# Patient Record
Sex: Female | Born: 1955 | Race: White | Hispanic: No | Smoking: Former smoker
Health system: Southern US, Community
[De-identification: ages and names within clinical notes are randomized; demographics above are authoritative.]

## PROBLEM LIST (undated history)

## (undated) DIAGNOSIS — N814 Uterovaginal prolapse, unspecified: Secondary | ICD-10-CM

## (undated) DIAGNOSIS — I471 Supraventricular tachycardia: Secondary | ICD-10-CM

## (undated) DIAGNOSIS — I10 Essential (primary) hypertension: Secondary | ICD-10-CM

## (undated) DIAGNOSIS — G935 Compression of brain: Secondary | ICD-10-CM

## (undated) DIAGNOSIS — K5792 Diverticulitis of intestine, part unspecified, without perforation or abscess without bleeding: Secondary | ICD-10-CM

## (undated) HISTORY — PX: OTHER SURGICAL HISTORY: SHX169

## (undated) HISTORY — PX: ABDOMINAL SURGERY: SHX537

## (undated) HISTORY — DX: Essential (primary) hypertension: I10

## (undated) HISTORY — DX: Supraventricular tachycardia: I47.1

## (undated) HISTORY — PX: ABDOMINAL HYSTERECTOMY: SHX81

## (undated) HISTORY — PX: COLON RESECTION: SHX5231

---

## 2012-07-26 ENCOUNTER — Encounter (HOSPITAL_COMMUNITY): Payer: Self-pay | Admitting: Emergency Medicine

## 2012-07-26 ENCOUNTER — Emergency Department (HOSPITAL_COMMUNITY)
Admission: EM | Admit: 2012-07-26 | Discharge: 2012-07-26 | Disposition: A | Discharge status: Home / Self Care | Payer: Medicaid - Out of State | Attending: Emergency Medicine | Admitting: Emergency Medicine

## 2012-07-26 ENCOUNTER — Emergency Department (HOSPITAL_COMMUNITY): Payer: Medicaid - Out of State

## 2012-07-26 DIAGNOSIS — I10 Essential (primary) hypertension: Secondary | ICD-10-CM | POA: Insufficient documentation

## 2012-07-26 DIAGNOSIS — S2232XA Fracture of one rib, left side, initial encounter for closed fracture: Secondary | ICD-10-CM

## 2012-07-26 DIAGNOSIS — Y9289 Other specified places as the place of occurrence of the external cause: Secondary | ICD-10-CM | POA: Insufficient documentation

## 2012-07-26 DIAGNOSIS — Y9389 Activity, other specified: Secondary | ICD-10-CM | POA: Insufficient documentation

## 2012-07-26 DIAGNOSIS — Z79899 Other long term (current) drug therapy: Secondary | ICD-10-CM | POA: Insufficient documentation

## 2012-07-26 DIAGNOSIS — W108XXA Fall (on) (from) other stairs and steps, initial encounter: Secondary | ICD-10-CM | POA: Insufficient documentation

## 2012-07-26 DIAGNOSIS — Z8719 Personal history of other diseases of the digestive system: Secondary | ICD-10-CM | POA: Insufficient documentation

## 2012-07-26 DIAGNOSIS — S2239XA Fracture of one rib, unspecified side, initial encounter for closed fracture: Secondary | ICD-10-CM | POA: Insufficient documentation

## 2012-07-26 HISTORY — DX: Diverticulitis of intestine, part unspecified, without perforation or abscess without bleeding: K57.92

## 2012-07-26 MED ORDER — OXYCODONE-ACETAMINOPHEN 5-325 MG PO TABS: 1.0000 | ORAL_TABLET | ORAL | Status: DC | PRN

## 2012-07-26 MED ORDER — HYDROCODONE-ACETAMINOPHEN 5-325 MG PO TABS: 1.0000 | ORAL_TABLET | ORAL | Status: DC | PRN

## 2012-07-26 MED ORDER — HYDROCODONE-ACETAMINOPHEN 5-325 MG PO TABS
1.0000 | ORAL_TABLET | Freq: Once | ORAL | Status: AC
  Administered 2012-07-26: 1 via ORAL
  Filled 2012-07-26: qty 1

## 2012-07-26 NOTE — ED Notes (Signed)
Pt given incentive spirometer. Pt instructed on usage. Pt returned demonstration and verbalized understanding of device.

## 2012-07-26 NOTE — ED Provider Notes (Signed)
Medical screening examination/treatment/procedure(s) were performed by non-physician practitioner and as supervising physician I was immediately available for consultation/collaboration.   Gavin Pound. Karolina Zamor, MD 07/26/12 1810

## 2012-07-26 NOTE — ED Provider Notes (Signed)
History    This chart was scribed for non-physician practitioner working with Gavin Pound. Oletta Lamas, MD by Leone Payor, ED Scribe. This patient was seen in room WTR6/WTR6 and the patient's care was started at 1504.   CSN: [id number]  Arrival date & time 07/26/12  1504   First MD Initiated Contact with Patient 07/26/12 1639      Chief Complaint  Patient presents with  . Fall     The history is provided by the patient. No language interpreter was used.    Adrienne Marsh is a 57 y.o. female who presents to the Emergency Department complaining of new, constant, unchanged right sided rib cage pain starting 1 day ago. Pt reports that last night she fell on her left side going down 4 steps on her porch, reports her knee gave out. States she has not had problems in the past with her legs and she felt fine prior to the fall. Pt states she was okay when she fell but while sleeping she felt a crack and had significant pain after that. Her pain is sharp and stabbing. Pain is worse with deep breathing, movement and touch. States bracing the area and staying still helps her pain. Denies LOC, head injury, dizziness, lightheadedness, vomiting, cough or coughing blood, weakness or numbness in extremities.     Pt has h/o HTN.  Pt is an occasional alcohol user but denies smoking. Past Medical History  Diagnosis Date  . Hypertension   . Diverticulitis     Past Surgical History  Procedure Laterality Date  . Abdominal hysterectomy      No family history on file.  History  Substance Use Topics  . Smoking status: Not on file  . Smokeless tobacco: Not on file  . Alcohol Use: Yes    No OB history provided.   Review of Systems  Respiratory: Negative for cough.   Gastrointestinal: Negative for vomiting.  Neurological: Negative for dizziness, syncope, weakness, light-headedness and numbness.    Allergies  Review of patient's allergies indicates no known allergies.  Home Medications   Current  Outpatient Rx  Name  Route  Sig  Dispense  Refill  . calcium-vitamin D (OSCAL WITH D) 500-200 MG-UNIT per tablet   Oral   Take 1 tablet by mouth 2 (two) times daily.         . metoprolol tartrate (LOPRESSOR) 25 MG tablet   Oral   Take 25 mg by mouth 2 (two) times daily.         Marland Kitchen terbinafine (LAMISIL) 250 MG tablet   Oral   Take 250 mg by mouth every morning.         . tolterodine (DETROL) 2 MG tablet   Oral   Take 2 mg by mouth every evening.         . triamterene-hydrochlorothiazide (DYAZIDE) 37.5-25 MG per capsule   Oral   Take 1 capsule by mouth every evening.         . Venlafaxine HCl 225 MG TB24   Oral   Take 1 tablet by mouth every evening.           BP 143/52  Pulse 69  Temp(Src) 98.8 F (37.1 C) (Oral)  Resp 16  SpO2 100%  Physical Exam  Nursing note and vitals reviewed. Constitutional: She appears well-developed and well-nourished. No distress.  HENT:  Head: Normocephalic and atraumatic.  Neck: Neck supple.  Cardiovascular: Normal rate and regular rhythm.   Pulmonary/Chest: Effort normal and breath sounds  normal. No respiratory distress. She has no wheezes. She has no rales. She exhibits tenderness.    Abdominal: Soft. She exhibits no distension and no mass. There is no tenderness. There is no rebound and no guarding.  Musculoskeletal:  Spine non tender, no crepitus, no step offs.   Neurological: She is alert.  Skin: She is not diaphoretic.    ED Course  Procedures (including critical care time)  DIAGNOSTIC STUDIES: Oxygen Saturation is 100% on room air, normal by my interpretation.    COORDINATION OF CARE: 4:41 PM Discussed treatment plan with pt at bedside and pt agreed to plan.    Labs Reviewed - No data to display Dg Ribs Unilateral W/chest Left  07/26/2012  *RADIOLOGY REPORT*  Clinical Data: History of fall complaining of left-sided rib pain.  LEFT RIBS AND CHEST - 3+ VIEW  Comparison: No priors.  Findings: There is an acute  nondisplaced fracture of the posterolateral aspect of the left seventh rib.  No associated pneumothorax.  Linear opacities in the base of the left hemithorax are most compatible with subsegmental atelectasis.  No acute consolidative airspace disease.  No pleural effusions.  Mild pulmonary vascular congestion, without frank pulmonary edema. Heart size is borderline enlarged. The patient is rotated to the right on today's exam, resulting in distortion of the mediastinal contours and reduced diagnostic sensitivity and specificity for mediastinal pathology.  IMPRESSION: 1.  Acute nondisplaced fracture through the posterolateral aspect of the left seventh rib.  No associated pneumothorax.  There is some mild subsegmental atelectasis at the left base presumably secondary to splinting.   Original Report Authenticated By: Trudie Reed, M.D.    Definitive fracture care.    1. Rib fracture, left, closed, initial encounter      MDM  Pt with fall down several steps yesterday, resulting in pain over left ribs.  Did not hit head, no LOC, acting normally.  No neuro complaints.  Tender over left ribs.  Lungs CTAB.  No s/s pneumonia, pneumothorax.  CXR shows nondisplaced fracture.  Discussed treatment, given incentive spirometer.  Discussed all results with patient.  Pt given return precautions.  Pt verbalizes understanding and agrees with plan.         I personally performed the services described in this documentation, which was scribed in my presence. The recorded information has been reviewed and is accurate.   Trixie Dredge, PA-C 07/26/12 1753

## 2012-07-26 NOTE — ED Notes (Addendum)
Pt c/o l side rib cage pain x1 day, pain is worse with movement and touch.  Pt reports that last night she fell going downstairs, reports that her knee gave out.  Denies head injury or LOC.

## 2012-11-17 ENCOUNTER — Emergency Department (HOSPITAL_COMMUNITY): Payer: Worker's Compensation

## 2012-11-17 ENCOUNTER — Encounter (HOSPITAL_COMMUNITY): Payer: Self-pay | Admitting: *Deleted

## 2012-11-17 ENCOUNTER — Emergency Department (HOSPITAL_COMMUNITY)
Admission: EM | Admit: 2012-11-17 | Discharge: 2012-11-17 | Disposition: A | Discharge status: Home / Self Care | Payer: Worker's Compensation | Attending: Emergency Medicine | Admitting: Emergency Medicine

## 2012-11-17 DIAGNOSIS — Z79899 Other long term (current) drug therapy: Secondary | ICD-10-CM | POA: Insufficient documentation

## 2012-11-17 DIAGNOSIS — S63509A Unspecified sprain of unspecified wrist, initial encounter: Secondary | ICD-10-CM | POA: Insufficient documentation

## 2012-11-17 DIAGNOSIS — I10 Essential (primary) hypertension: Secondary | ICD-10-CM | POA: Insufficient documentation

## 2012-11-17 DIAGNOSIS — Y9389 Activity, other specified: Secondary | ICD-10-CM | POA: Insufficient documentation

## 2012-11-17 DIAGNOSIS — Z8719 Personal history of other diseases of the digestive system: Secondary | ICD-10-CM | POA: Insufficient documentation

## 2012-11-17 DIAGNOSIS — W19XXXA Unspecified fall, initial encounter: Secondary | ICD-10-CM | POA: Insufficient documentation

## 2012-11-17 DIAGNOSIS — Y9289 Other specified places as the place of occurrence of the external cause: Secondary | ICD-10-CM | POA: Insufficient documentation

## 2012-11-17 DIAGNOSIS — S0993XA Unspecified injury of face, initial encounter: Secondary | ICD-10-CM | POA: Insufficient documentation

## 2012-11-17 LAB — RAPID URINE DRUG SCREEN, HOSP PERFORMED
Amphetamines: NOT DETECTED
Barbiturates: NOT DETECTED
Benzodiazepines: NOT DETECTED
Cocaine: NOT DETECTED
Opiates: NOT DETECTED
Tetrahydrocannabinol: NOT DETECTED

## 2012-11-17 MED ORDER — HYDROCODONE-ACETAMINOPHEN 5-325 MG PO TABS: ORAL_TABLET | ORAL | Status: DC

## 2012-11-17 MED ORDER — IBUPROFEN 200 MG PO TABS
400.0000 mg | ORAL_TABLET | Freq: Once | ORAL | Status: AC
  Administered 2012-11-17: 400 mg via ORAL
  Filled 2012-11-17: qty 2

## 2012-11-17 MED ORDER — ACETAMINOPHEN-CODEINE #3 300-30 MG PO TABS: 1.0000 | ORAL_TABLET | Freq: Four times a day (QID) | ORAL | Status: DC | PRN

## 2012-11-17 MED ORDER — CYCLOBENZAPRINE HCL 10 MG PO TABS: 10.0000 mg | ORAL_TABLET | Freq: Three times a day (TID) | ORAL | Status: DC | PRN

## 2012-11-17 MED ORDER — IBUPROFEN 600 MG PO TABS: 600.0000 mg | ORAL_TABLET | Freq: Three times a day (TID) | ORAL | Status: DC | PRN

## 2012-11-17 NOTE — ED Provider Notes (Signed)
History    CSN: [id number] Arrival date & time 11/17/12  4098  First MD Initiated Contact with Patient 11/17/12 [phone]     Chief Complaint  Patient presents with  . Wrist Injury   (Consider location/radiation/quality/duration/timing/severity/associated sxs/prior Treatment) Patient is a 57 y.o. female presenting with wrist injury. The history is provided by the patient.  Wrist Injury Location:  Wrist Time since incident:  10 hours Injury: yes   Mechanism of injury: fall   Fall:    Fall occurred:  Standing   Impact surface:  Hard floor   Point of impact:  Back and hands   Entrapped after fall: no   Wrist location:  R wrist Pain details:    Quality:  Aching, throbbing and sharp   Radiates to:  R forearm and R fingers   Severity:  Moderate   Onset quality:  Gradual   Duration:  10 hours   Timing:  Constant   Progression:  Worsening Chronicity:  New Handedness:  Right-handed Dislocation: no   Foreign body present:  No foreign bodies Worsened by:  Movement and stretching area Ineffective treatments:  Being still Associated symptoms: neck pain   Associated symptoms: no back pain   Risk factors: no concern for non-accidental trauma and no known bone disorder    Past Medical History  Diagnosis Date  . Hypertension   . Diverticulitis    Past Surgical History  Procedure Laterality Date  . Abdominal hysterectomy     History reviewed. No pertinent family history. History  Substance Use Topics  . Smoking status: Never Smoker   . Smokeless tobacco: Not on file  . Alcohol Use: Yes     Comment: occasionally   OB History   Grav Para Term Preterm Abortions TAB SAB Ect Mult Living                 Review of Systems  HENT: Positive for neck pain. Negative for neck stiffness.   Musculoskeletal: Positive for joint swelling and arthralgias. Negative for back pain.  Neurological: Negative for weakness and numbness.    Allergies  Review of patient's allergies indicates no  known allergies.  Home Medications   Current Outpatient Rx  Name  Route  Sig  Dispense  Refill  . aspirin 325 MG tablet   Oral   Take 325 mg by mouth every 6 (six) hours as needed for pain.         Marland Kitchen buPROPion (WELLBUTRIN XL) 150 MG 24 hr tablet   Oral   Take 150 mg by mouth every morning.         Marland Kitchen acetaminophen-codeine (TYLENOL #3) 300-30 MG per tablet   Oral   Take 1-2 tablets by mouth every 6 (six) hours as needed for pain.   20 tablet   0   . cyclobenzaprine (FLEXERIL) 10 MG tablet   Oral   Take 1 tablet (10 mg total) by mouth 3 (three) times daily as needed for muscle spasms.   15 tablet   0   . ibuprofen (ADVIL,MOTRIN) 600 MG tablet   Oral   Take 1 tablet (600 mg total) by mouth every 8 (eight) hours as needed for pain.   30 tablet   0    BP 161/70  Pulse 87  Temp(Src) 99 F (37.2 C) (Oral)  Resp 18  SpO2 100% Physical Exam  Nursing note and vitals reviewed. Constitutional: She is oriented to person, place, and time. She appears well-developed and well-nourished. No distress.  HENT:  Head: Normocephalic and atraumatic.  Neck: Normal range of motion. Neck supple. Muscular tenderness present. No spinous process tenderness present. Normal range of motion present.  Pulmonary/Chest: Effort normal. No respiratory distress.  Abdominal: Soft.  Musculoskeletal:       Right shoulder: Normal.       Right elbow: Normal.      Right wrist: She exhibits decreased range of motion and tenderness. She exhibits no swelling, no effusion, no crepitus, no deformity and no laceration.       Cervical back: She exhibits tenderness and pain. She exhibits no bony tenderness and no deformity.       Thoracic back: She exhibits no tenderness.       Lumbar back: She exhibits no tenderness.       Right hand: Normal.  Neurological: She is alert and oriented to person, place, and time. She exhibits normal muscle tone. Coordination normal.  Skin: Skin is warm.    ED Course   Procedures (including critical care time) Labs Reviewed  URINE RAPID DRUG SCREEN (HOSP PERFORMED)   Dg Wrist Complete Right  11/17/2012   *RADIOLOGY REPORT*  Clinical Data: Wrist pain after fall from outstretched arm.  RIGHT WRIST - COMPLETE 3+ VIEW  Comparison: None.  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  The right wrist appears otherwise intact. No evidence of acute fracture or subluxation.  No focal bone lesion or bone destruction.  The bone cortex and trabecular architecture appear intact.  No radiopaque soft tissue foreign bodies.  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  No displaced fractures identified.   Original Report Authenticated By: Burman Nieves, M.D.   1. Wrist sprain and strain, right, initial encounter    RA sat is 100% and I interpret to be normal  MDM  Pt with wrist pain, minimal swelling, minimal pain at snuffbox  Gavin Pound. Oletta Lamas, MD 11/17/12 [phone]

## 2012-11-17 NOTE — ED Notes (Signed)
Pt was at work, slipped in water and fell, injuring R wrist. Pt has decreased mobility and pain, but limited swelling and no bruising evident.

## 2013-04-16 ENCOUNTER — Encounter (HOSPITAL_COMMUNITY): Payer: Self-pay | Admitting: Emergency Medicine

## 2013-04-16 ENCOUNTER — Emergency Department (HOSPITAL_COMMUNITY)
Admission: EM | Admit: 2013-04-16 | Discharge: 2013-04-16 | Disposition: A | Discharge status: Home / Self Care | Payer: Self-pay | Attending: Emergency Medicine | Admitting: Emergency Medicine

## 2013-04-16 ENCOUNTER — Emergency Department (HOSPITAL_COMMUNITY): Payer: Self-pay

## 2013-04-16 DIAGNOSIS — I10 Essential (primary) hypertension: Secondary | ICD-10-CM | POA: Insufficient documentation

## 2013-04-16 DIAGNOSIS — Y9389 Activity, other specified: Secondary | ICD-10-CM | POA: Insufficient documentation

## 2013-04-16 DIAGNOSIS — Z79899 Other long term (current) drug therapy: Secondary | ICD-10-CM | POA: Insufficient documentation

## 2013-04-16 DIAGNOSIS — IMO0002 Reserved for concepts with insufficient information to code with codable children: Secondary | ICD-10-CM | POA: Insufficient documentation

## 2013-04-16 DIAGNOSIS — Y929 Unspecified place or not applicable: Secondary | ICD-10-CM | POA: Insufficient documentation

## 2013-04-16 DIAGNOSIS — W1789XA Other fall from one level to another, initial encounter: Secondary | ICD-10-CM | POA: Insufficient documentation

## 2013-04-16 DIAGNOSIS — S8391XA Sprain of unspecified site of right knee, initial encounter: Secondary | ICD-10-CM

## 2013-04-16 DIAGNOSIS — Z8719 Personal history of other diseases of the digestive system: Secondary | ICD-10-CM | POA: Insufficient documentation

## 2013-04-16 MED ORDER — CYCLOBENZAPRINE HCL 5 MG PO TABS: 5.0000 mg | ORAL_TABLET | Freq: Three times a day (TID) | ORAL | Status: DC | PRN

## 2013-04-16 MED ORDER — IBUPROFEN 600 MG PO TABS: 600.0000 mg | ORAL_TABLET | Freq: Four times a day (QID) | ORAL | Status: DC | PRN

## 2013-04-16 MED ORDER — IBUPROFEN 400 MG PO TABS
800.0000 mg | ORAL_TABLET | Freq: Once | ORAL | Status: AC
  Administered 2013-04-16: 800 mg via ORAL
  Filled 2013-04-16: qty 2

## 2013-04-16 MED ORDER — HYDROCODONE-ACETAMINOPHEN 5-325 MG PO TABS: 1.0000 | ORAL_TABLET | Freq: Four times a day (QID) | ORAL | Status: DC | PRN

## 2013-04-16 NOTE — ED Provider Notes (Signed)
CSN: [id number]     Arrival date & time 04/16/13  1440 History   First MD Initiated Contact with Patient 04/16/13 1520    This chart was scribed for Jaynie Crumble PA-C, a non-physician practitioner working with Shanna Cisco, MD by Lewanda Rife, ED Scribe. This patient was seen in room TR08C/TR08C and the patient's care was started at 3:43 PM     Chief Complaint  Patient presents with  . Fall  . Leg Pain   (Consider location/radiation/quality/duration/timing/severity/associated sxs/prior Treatment) The history is provided by the patient. No language interpreter was used.   HPI Comments: Adrienne Marsh is a 57 y.o. female who presents to the Emergency Department complaining of mechanical fall from a stool while trying to kill a bug onset PTA. Reports constant worsening posterior right knee pain. Reports pain is exacerbated by touch, ROM, and weight bearing. Denies alleviating factors. Denies associated numbness. Denies pertinent PMHx.    Past Medical History  Diagnosis Date  . Hypertension   . Diverticulitis    Past Surgical History  Procedure Laterality Date  . Abdominal hysterectomy     No family history on file. History  Substance Use Topics  . Smoking status: Never Smoker   . Smokeless tobacco: Not on file  . Alcohol Use: Yes     Comment: occasionally   OB History   Grav Para Term Preterm Abortions TAB SAB Ect Mult Living                 Review of Systems  Musculoskeletal: Positive for arthralgias and myalgias.  Neurological: Negative for numbness.   A complete 10 system review of systems was obtained and all systems are negative except as noted in the HPI and PMHx.    Allergies  Codeine  Home Medications   Current Outpatient Rx  Name  Route  Sig  Dispense  Refill  . aluminum chloride (DRYSOL) 20 % external solution   Topical   Apply 1 application topically at bedtime.         Marland Kitchen LORazepam (ATIVAN) 1 MG tablet   Oral   Take 1 mg by mouth 2  (two) times daily.         . sertraline (ZOLOFT) 100 MG tablet   Oral   Take 100 mg by mouth daily.          BP 148/64  Pulse 71  Temp(Src) 98.1 F (36.7 C) (Oral)  Resp 16  SpO2 97% Physical Exam  Nursing note and vitals reviewed. Constitutional: She is oriented to person, place, and time. She appears well-developed and well-nourished. No distress.  HENT:  Head: Normocephalic and atraumatic.  Eyes: EOM are normal.  Neck: Neck supple. No tracheal deviation present.  Cardiovascular: Normal rate.   Pulmonary/Chest: Effort normal. No respiratory distress.  Musculoskeletal: She exhibits tenderness.       Right knee: She exhibits decreased range of motion (secondary to pain ).  No tenderness to palpation over ankle or foot. Pain with right foot dorsiflexion. Mild TTP over right achilles, but intact. Normal appearing right knee. Tender to palpation over medial joint and posterior knee. Decreased ROM of the knee joint. Pain with full flexion and extension. Negative anterior and posterior drawer signs, however pain with anterior drawer. No laxity or pain with medial or lateral stress.    Neurological: She is alert and oriented to person, place, and time.  Skin: Skin is warm and dry.  Psychiatric: She has a normal mood and affect. Her behavior  is normal.    ED Course  Procedures (including critical care time)   COORDINATION OF CARE:  Nursing notes reviewed. Vital signs reviewed. Initial pt interview and examination performed.   3:49 PM-Discussed work up plan with pt at bedside, which includes x-ray of right knee. Pt agrees with plan.   Treatment plan initiated:Medications - No data to display   Initial diagnostic testing ordered.    Labs Review Labs Reviewed - No data to display Imaging Review Dg Knee Complete 4 Views Right  04/16/2013   CLINICAL DATA:  Pain post trauma  EXAM: RIGHT KNEE - COMPLETE 4+ VIEW  COMPARISON:  None.  FINDINGS: Frontal, lateral, and bilateral  oblique views were obtained. There is no fracture or dislocation. There is a small joint effusion. Joint spaces appear intact. No erosive change.  IMPRESSION: Small joint effusion. No fracture. No appreciable joint space narrowing.   Electronically Signed   By: Bretta Bang M.D.   On: 04/16/2013 16:39    EKG Interpretation   None       MDM   1. Right knee sprain, initial encounter      Knee x-ray showing no fracture, small effusion. Suspect a sprain vs ligamentous injury. D/c home with knee immobilizer, crutches, ibuprofen, norco, flexeril. Follow up with primary care doctor or an orthopedics specialist.    Lottie Mussel, PA-C 04/16/13 1713

## 2013-04-16 NOTE — ED Notes (Signed)
Pt c/o left lower leg pain after falling from a chair. Pt states she is having difficulty ambulating. Pt rates pain 10/10 when ambulating.

## 2013-04-16 NOTE — ED Notes (Signed)
Pt called with no response in main ED waiting area 

## 2013-04-16 NOTE — ED Notes (Signed)
Pt presents to department for evaluation of R leg pain. States she was attempting to kill bug on wall, when she slipped off stool. Now states R leg pain. 8/10 pain. No obvious deformities noted. Able to move R leg without difficulty. Pt is alert and oriented x4. NAD.

## 2013-04-17 NOTE — ED Provider Notes (Signed)
Medical screening examination/treatment/procedure(s) were performed by non-physician practitioner and as supervising physician I was immediately available for consultation/collaboration.  EKG Interpretation   None         Megan E Docherty, MD 04/17/13 0109 

## 2013-04-21 ENCOUNTER — Other Ambulatory Visit (HOSPITAL_COMMUNITY): Payer: Self-pay | Admitting: Sports Medicine

## 2013-04-21 DIAGNOSIS — M25561 Pain in right knee: Secondary | ICD-10-CM

## 2013-04-24 ENCOUNTER — Ambulatory Visit (HOSPITAL_COMMUNITY): Admission: RE | Admit: 2013-04-24 | Payer: PRIVATE HEALTH INSURANCE | Source: Ambulatory Visit

## 2014-02-09 ENCOUNTER — Emergency Department (HOSPITAL_COMMUNITY)
Admission: EM | Admit: 2014-02-09 | Discharge: 2014-02-09 | Disposition: A | Discharge status: Home / Self Care | Payer: BC Managed Care – PPO | Attending: Emergency Medicine | Admitting: Emergency Medicine

## 2014-02-09 ENCOUNTER — Encounter (HOSPITAL_COMMUNITY): Payer: Self-pay | Admitting: Emergency Medicine

## 2014-02-09 DIAGNOSIS — R42 Dizziness and giddiness: Secondary | ICD-10-CM | POA: Diagnosis not present

## 2014-02-09 DIAGNOSIS — Z8669 Personal history of other diseases of the nervous system and sense organs: Secondary | ICD-10-CM | POA: Diagnosis not present

## 2014-02-09 DIAGNOSIS — I10 Essential (primary) hypertension: Secondary | ICD-10-CM | POA: Diagnosis not present

## 2014-02-09 DIAGNOSIS — R000 Tachycardia, unspecified: Secondary | ICD-10-CM | POA: Diagnosis present

## 2014-02-09 DIAGNOSIS — Z87891 Personal history of nicotine dependence: Secondary | ICD-10-CM | POA: Insufficient documentation

## 2014-02-09 DIAGNOSIS — Z79899 Other long term (current) drug therapy: Secondary | ICD-10-CM | POA: Diagnosis not present

## 2014-02-09 DIAGNOSIS — Z8742 Personal history of other diseases of the female genital tract: Secondary | ICD-10-CM | POA: Diagnosis not present

## 2014-02-09 DIAGNOSIS — I471 Supraventricular tachycardia, unspecified: Secondary | ICD-10-CM

## 2014-02-09 DIAGNOSIS — Z8719 Personal history of other diseases of the digestive system: Secondary | ICD-10-CM | POA: Diagnosis not present

## 2014-02-09 DIAGNOSIS — R0602 Shortness of breath: Secondary | ICD-10-CM | POA: Insufficient documentation

## 2014-02-09 HISTORY — DX: Compression of brain: G93.5

## 2014-02-09 HISTORY — DX: Uterovaginal prolapse, unspecified: N81.4

## 2014-02-09 HISTORY — DX: Supraventricular tachycardia: I47.1

## 2014-02-09 HISTORY — DX: Supraventricular tachycardia, unspecified: I47.10

## 2014-02-09 LAB — I-STAT CHEM 8, ED
BUN: 26 mg/dL — AB (ref 6–23)
CHLORIDE: 108 meq/L (ref 96–112)
Calcium, Ion: 1.08 mmol/L — ABNORMAL LOW (ref 1.12–1.23)
Creatinine, Ser: 1 mg/dL (ref 0.50–1.10)
Glucose, Bld: 114 mg/dL — ABNORMAL HIGH (ref 70–99)
HEMATOCRIT: 42 % (ref 36.0–46.0)
Hemoglobin: 14.3 g/dL (ref 12.0–15.0)
POTASSIUM: 4.1 meq/L (ref 3.7–5.3)
Sodium: 140 mEq/L (ref 137–147)
TCO2: 20 mmol/L (ref 0–100)

## 2014-02-09 LAB — CBC WITH DIFFERENTIAL/PLATELET
Basophils Absolute: 0 10*3/uL (ref 0.0–0.1)
Basophils Relative: 0 % (ref 0–1)
Eosinophils Absolute: 0.3 10*3/uL (ref 0.0–0.7)
Eosinophils Relative: 3 % (ref 0–5)
HCT: 40.5 % (ref 36.0–46.0)
Hemoglobin: 13.7 g/dL (ref 12.0–15.0)
LYMPHS ABS: 2.2 10*3/uL (ref 0.7–4.0)
LYMPHS PCT: 21 % (ref 12–46)
MCH: 30 pg (ref 26.0–34.0)
MCHC: 33.8 g/dL (ref 30.0–36.0)
MCV: 88.6 fL (ref 78.0–100.0)
Monocytes Absolute: 0.6 10*3/uL (ref 0.1–1.0)
Monocytes Relative: 6 % (ref 3–12)
NEUTROS PCT: 70 % (ref 43–77)
Neutro Abs: 7.6 10*3/uL (ref 1.7–7.7)
Platelets: 321 10*3/uL (ref 150–400)
RBC: 4.57 MIL/uL (ref 3.87–5.11)
RDW: 12.6 % (ref 11.5–15.5)
WBC: 10.8 10*3/uL — ABNORMAL HIGH (ref 4.0–10.5)

## 2014-02-09 LAB — I-STAT TROPONIN, ED: Troponin i, poc: 0.01 ng/mL (ref 0.00–0.08)

## 2014-02-09 LAB — TSH: TSH: 2.28 u[IU]/mL (ref 0.350–4.500)

## 2014-02-09 MED ORDER — ADENOSINE 6 MG/2ML IV SOLN
6.0000 mg | Freq: Once | INTRAVENOUS | Status: AC
Start: 2014-02-09 — End: 2014-02-09
  Administered 2014-02-09: 6 mg via INTRAVENOUS

## 2014-02-09 MED ORDER — SODIUM CHLORIDE 0.9 % IV BOLUS (SEPSIS)
500.0000 mL | Freq: Once | INTRAVENOUS | Status: AC
  Administered 2014-02-09: 500 mL via INTRAVENOUS

## 2014-02-09 MED ORDER — ADENOSINE 6 MG/2ML IV SOLN
INTRAVENOUS | Status: AC
  Filled 2014-02-09: qty 2

## 2014-02-09 MED ORDER — METOPROLOL SUCCINATE ER 25 MG PO TB24: 25.0000 mg | ORAL_TABLET | Freq: Every day | ORAL | Status: DC

## 2014-02-09 MED ORDER — ADENOSINE 6 MG/2ML IV SOLN
INTRAVENOUS | Status: AC
  Filled 2014-02-09: qty 4

## 2014-02-09 NOTE — Discharge Instructions (Signed)
Supraventricular Tachycardia °Supraventricular tachycardia (SVT) is an abnormal heart rhythm (arrhythmia) that causes the heart to beat very fast (tachycardia). This kind of fast heartbeat originates in the upper chambers of the heart (atria). SVT can cause the heart to beat greater than 100 beats per minute. SVT can have a rapid burst of heartbeats. This can start and stop suddenly without warning and is called nonsustained. SVT can also be sustained, in which the heart beats at a continuous fast rate.  °CAUSES  °There can be different causes of SVT. Some of these include: °· Heart valve problems such as mitral valve prolapse. °· An enlarged heart (hypertrophic cardiomyopathy). °· Congenital heart problems. °· Heart inflammation (pericarditis). °· Hyperthyroidism. °· Low potassium or magnesium levels. °· Caffeine. °· Drug use such as cocaine, methamphetamines, or stimulants. °· Some over-the-counter medicines such as: °¨ Decongestants. °¨ Diet medicines. °¨ Herbal medicines. °SYMPTOMS  °Symptoms of SVT can vary. Symptoms depend on whether the SVT is sustained or nonsustained. You may experience: °· No symptoms (asymptomatic). °· An awareness of your heart beating rapidly (palpitations). °· Shortness of breath. °· Chest pain or pressure. °If your blood pressure drops because of the SVT, you may experience: °· Fainting or near fainting. °· Weakness. °· Dizziness. °DIAGNOSIS  °Different tests can be performed to diagnose SVT, such as: °· An electrocardiogram (EKG). This is a painless test that records the electrical activity of your heart. °· Holter monitor. This is a 24 hour recording of your heart rhythm. You will be given a diary. Write down all symptoms that you have and what you were doing at the time you experienced symptoms. °· Arrhythmia monitor. This is a small device that your wear for several weeks. It records the heart rhythm when you have symptoms. °· Echocardiogram. This is an imaging test to help detect  abnormal heart structure such as congenital abnormalities, heart valve problems, or heart enlargement. °· Stress test. This test can help determine if the SVT is related to exercise. °· Electrophysiology study (EPS). This is a procedure that evaluates your heart's electrical system and can help your caregiver find the cause of your SVT. °TREATMENT  °Treatment of SVT depends on the symptoms, how often it recurs, and whether there are any underlying heart problems.  °· If symptoms are rare and no other cardiac disease is present, no treatment may be needed. °· Blood work may be done to check potassium, magnesium, and thyroid hormone levels to see if they are abnormal. If these levels are abnormal, treatment to correct the problems will occur. °Medicines °Your caregiver may use oral medicines to treat SVT. These medicines are given for long-term control of SVT. Medicines may be used alone or in combination with other treatments. These medicines work to slow nerve impulses in the heart muscle. These medicines can also be used to treat high blood pressure. Some of these medicines may include: °· Calcium channel blockers. °· Beta blockers. °· Digoxin. °Nonsurgical procedures °Nonsurgical techniques may be used if oral medicines do not work. Some examples include: °· Cardioversion. This technique uses either drugs or an electrical shock to restore a normal heart rhythm. °¨ Cardioversion drugs may be given through an intravenous (IV) line to help "reset" the heart rhythm. °¨ In electrical cardioversion, the caregiver shocks your heart to stop its beat for a split second. This helps to reset the heart to a normal rhythm. °· Ablation. This procedure is done under mild sedation. High frequency radio wave energy is used to   destroy the area of heart tissue responsible for the SVT. °HOME CARE INSTRUCTIONS  °· Do not smoke. °· Only take medicines prescribed by your caregiver. Check with your caregiver before using over-the-counter  medicines. °· Check with your caregiver about how much alcohol and caffeine (coffee, tea, colas, or chocolate) you may have. °· It is very important to keep all follow-up referrals and appointments in order to properly manage this problem. °SEEK IMMEDIATE MEDICAL CARE IF: °· You have dizziness. °· You faint or nearly faint. °· You have shortness of breath. °· You have chest pain or pressure. °· You have sudden nausea or vomiting. °· You have profuse sweating. °· You are concerned about how long your symptoms last. °· You are concerned about the frequency of your SVT episodes. °If you have the above symptoms, call your local emergency services (911 in [place] immediately. Do not drive yourself to the hospital. °MAKE SURE YOU:  °· Understand these instructions. °· Will watch your condition. °· Will get help right away if you are not doing well or get worse. °Document Released: 04/24/2005 Document Revised: 07/17/2011 Document Reviewed: 08/06/2008 °[employer]® Patient Information ©2015 [employer]. This information is not intended to replace advice given to you by your health care provider. Make sure you discuss any questions you have with your health care provider. ° °

## 2014-02-09 NOTE — ED Provider Notes (Signed)
CSN: [id number]     Arrival date & time 02/09/14  1047 History   First MD Initiated Contact with Patient 02/09/14 1105     Chief Complaint  Patient presents with  . rapid heartbeat      (Consider location/radiation/quality/duration/timing/severity/associated sxs/prior Treatment) The history is provided by the patient.   patient states that about an hour prior to arrival she began to feel her heart racing feels lightheaded. She states she was working at the time. She denies chest pain, but states she does feel a tightness. No fevers. No cough. She states she has some mild shortness of breath. He has not had episodes like this before. She has had 2 cups of coffee, which is not abnormal for her. No weight loss. No headache. No confusion. No numbness or weakness. Found to have a heart rate of 170 initially. She states that she drove herself to the ER.  Past Medical History  Diagnosis Date  . Hypertension   . Diverticulitis   . Uterine prolapse   . Hernia cerebri    Past Surgical History  Procedure Laterality Date  . Abdominal hysterectomy    . Uterine prolapse repair     Family History  Problem Relation Age of Onset  . Cancer Mother   . Cancer Father    History  Substance Use Topics  . Smoking status: Former Smoker -- 1.00 packs/day  . Smokeless tobacco: Not on file  . Alcohol Use: Yes     Comment: occasionally   OB History   Grav Para Term Preterm Abortions TAB SAB Ect Mult Living                 Review of Systems  Constitutional: Negative for activity change and appetite change.  Eyes: Negative for pain.  Respiratory: Positive for shortness of breath. Negative for cough and chest tightness.   Cardiovascular: Positive for palpitations. Negative for chest pain and leg swelling.  Gastrointestinal: Negative for nausea, vomiting, abdominal pain and diarrhea.  Genitourinary: Negative for flank pain.  Musculoskeletal: Negative for back pain and neck stiffness.  Skin: Negative  for rash.  Neurological: Negative for weakness, numbness and headaches.  Psychiatric/Behavioral: Negative for behavioral problems.      Allergies  Codeine  Home Medications   Prior to Admission medications   Medication Sig Start Date End Date Taking? Authorizing Provider  aluminum chloride (DRYSOL) 20 % external solution Apply 1 application topically at bedtime.   Yes Historical Provider, MD  ibuprofen (ADVIL,MOTRIN) 200 MG tablet Take 600 mg by mouth every 6 (six) hours as needed for moderate pain.   Yes Historical Provider, MD  lisinopril (PRINIVIL,ZESTRIL) 10 MG tablet Take 10 mg by mouth daily.   Yes Historical Provider, MD  LORazepam (ATIVAN) 1 MG tablet Take 1 mg by mouth at bedtime as needed for anxiety or sleep.    Yes Historical Provider, MD  sertraline (ZOLOFT) 100 MG tablet Take 100 mg by mouth daily.   Yes Historical Provider, MD  metoprolol succinate (TOPROL-XL) 25 MG 24 hr tablet Take 1 tablet (25 mg total) by mouth daily. 02/09/14   Juliet RudeNathan R. Teancum Brule, MD   BP 115/67  Pulse 78  Temp(Src) 98.6 F (37 C) (Oral)  Resp 20  Ht 5\' 11"  (1.803 m)  Wt 190 lb (86.183 kg)  BMI 26.51 kg/m2  SpO2 96% Physical Exam  Nursing note and vitals reviewed. Constitutional: She is oriented to person, place, and time. She appears well-developed and well-nourished.  HENT:  Head:  Normocephalic and atraumatic.  Eyes: EOM are normal. Pupils are equal, round, and reactive to light.  Neck: Normal range of motion. Neck supple.  Cardiovascular: Regular rhythm and normal heart sounds.   No murmur heard. Tachycardia  Pulmonary/Chest: Effort normal and breath sounds normal. No respiratory distress. She has no wheezes. She has no rales.  Abdominal: Soft. Bowel sounds are normal. She exhibits no distension. There is no tenderness. There is no rebound and no guarding.  Musculoskeletal: Normal range of motion.  Neurological: She is alert and oriented to person, place, and time. No cranial nerve  deficit.  Skin: Skin is warm and dry.  Psychiatric: She has a normal mood and affect. Her speech is normal.    ED Course  Procedures (including critical care time) Labs Review Labs Reviewed  CBC WITH DIFFERENTIAL - Abnormal; Notable for the following:    WBC 10.8 (*)    All other components within normal limits  I-STAT CHEM 8, ED - Abnormal; Notable for the following:    BUN 26 (*)    Glucose, Bld 114 (*)    Calcium, Ion 1.08 (*)    All other components within normal limits  TSH  I-STAT TROPOININ, ED    Imaging Review No results found.   EKG Interpretation   Date/Time:  Monday February 09 2014 11:00:48 EDT Ventricular Rate:  171 PR Interval:  142 QRS Duration: 85 QT Interval:  278 QTC Calculation: 469 R Axis:   35 Text Interpretation:  Supraventricular tachycardia ST depression, probably  rate related Baseline wander in lead(s) I II aVR Confirmed by Rubin Payor   MD, Harrold Donath [phone]) on 02/09/2014 12:34:10 PM      MDM   Final diagnoses:  SVT (supraventricular tachycardia)    Patient with SVT. Blood pressure maintained, but patient feels lightheaded. EKG shows SVT. Patient was given 6 mg  adenosine with conversion back to sinus rhythm. Lab work overall reassuring. Repeat EKG showed sinus rhythm. Will discharge home followup with cardiology. Will start low-dose beta-blockade.      Juliet Rude. Rubin Payor, MD 02/09/14 1312

## 2014-02-09 NOTE — ED Notes (Signed)
Patient was at work this AM and began feeling like her heart was pounding fast, feeling like she was going to pass out, and dizziness. Patiaent drove herself to the ED.

## 2014-02-18 ENCOUNTER — Encounter: Payer: Self-pay | Admitting: Cardiology

## 2014-02-18 ENCOUNTER — Ambulatory Visit (INDEPENDENT_AMBULATORY_CARE_PROVIDER_SITE_OTHER): Payer: BC Managed Care – PPO | Admitting: Cardiology

## 2014-02-18 VITALS — BP 115/68 | HR 60 | Ht 71.0 in | Wt 190.2 lb

## 2014-02-18 DIAGNOSIS — I471 Supraventricular tachycardia: Secondary | ICD-10-CM

## 2014-02-18 DIAGNOSIS — R079 Chest pain, unspecified: Secondary | ICD-10-CM

## 2014-02-18 DIAGNOSIS — I10 Essential (primary) hypertension: Secondary | ICD-10-CM | POA: Insufficient documentation

## 2014-02-18 DIAGNOSIS — R55 Syncope and collapse: Secondary | ICD-10-CM

## 2014-02-18 MED ORDER — METOPROLOL SUCCINATE ER 25 MG PO TB24: 25.0000 mg | ORAL_TABLET | Freq: Every day | ORAL | Status: DC

## 2014-02-18 NOTE — Patient Instructions (Signed)
Your physician has requested that you have an echocardiogram. Echocardiography is a painless test that uses sound waves to create images of your heart. It provides your doctor with information about the size and shape of your heart and how well your heart's chambers and valves are working. This procedure takes approximately one hour. There are no restrictions for this procedure.  Follow directions as discussed with Dr. Herbie BaltimoreHarding   Your physician recommends that you schedule a follow-up appointment in:  3 months

## 2014-02-18 NOTE — Assessment & Plan Note (Addendum)
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 She doesn't drink a decent amount of the anesthetic and decongestant which could have triggered the episode. I recommended avoiding decongestant medicines that are oral in nature and not nasal spray. Also recommended cutting back her caffeine intake. This is in addition to symptomatically hydrated.  As we have a confirmed diagnosis of SVT but no need to wear a monitor. She was very symptomatic when she had the episode. We discussed different vagal maneuvers to use.  Plan: continue with low-dose beta blocker; when necessary vagal maneuvers as well as sitting down or lying around. With midsystolic click, there is a question of possible mitral prolapse. Will check 2-D echocardiogram to ensure  no structural abnormalities.

## 2014-02-18 NOTE — Progress Notes (Signed)
PATIENT: Adrienne Marsh MRN: [id number] DOB: [birth year] PCP: Devra Dopp, MD  Clinic Note: Chief Complaint  Patient presents with  . Other    F/u from ED due to chest pain/rapid heart beat and sob. Meds reviewed verbally with pt.    HPI: Adrienne Marsh is a 58 y.o. female with a PMH below who presents today for an emergency room followup with cardiology following an episode of near syncope with SVT. She reports being in her usual state of health besides having some increased congestion which led her to take some over-the-counter decongestants on the morning of the fifth. She had her standard 2 cups of coffee. 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 She felt as though she may black out. This was alleviated with sitting down. She felt her pulse and said her heart was racing very fast in a regular manner. She felt dizzy, lightheaded and short of breath but denied any chest discomfort or pressure. She simply felt her heart tracing. She did not lose consciousness. She did not go to the ER via EMS, but drove herself to the emergency room which was a delayed trip to 2 construction. On evaluation in the emergency room she was found to have a heart rate of about 170 with SVT - as noted on the EKG from initial evaluation at 11:00 on October 5th. She was treated with IV adenosine that broke the rhythm back into normal sinus rhythm. She has not had another episode of palpitations or rapid heart rate since. She immediately felt better. She ruled out for MI and had no more symptoms. She was therefore discharged history besides feeling a bit fatigued after the episode, she is not felt any notable symptoms. She was discharged emergency room on 25 mg of Toprol.  Prior to this episode she denied having any previous syncopal episodes with the exception of one time while pregnant she had an orthostatic episode. Otherwise she's not had any palpitations or rapid heart  beats, no syncope near syncope, TIA or CVAs. No chest pressure or dyspnea with rest or exertion. No PND, orthopnea or edema.  Past Medical History  Diagnosis Date  . Essential hypertension   . Diverticulitis   . Uterine prolapse   . Hernia cerebri   . Supraventricular tachycardia, paroxysmal 02/09/2014    Presented with near syncope broke with adenosine in the ER -     Prior Cardiac Evaluation and Past Surgical History: Past Surgical History  Procedure Laterality Date  . Abdominal hysterectomy    . Uterine prolapse repair      Allergies  Allergen Reactions  . Codeine     Makes me feel funny    Current Outpatient Prescriptions  Medication Sig Dispense Refill  . aluminum chloride (DRYSOL) 20 % external solution Apply 1 application topically at bedtime.      Marland Kitchen ibuprofen (ADVIL,MOTRIN) 200 MG tablet Take 600 mg by mouth every 6 (six) hours as needed for moderate pain.      Marland Kitchen lisinopril (PRINIVIL,ZESTRIL) 10 MG tablet Take 10 mg by mouth daily.      Marland Kitchen LORazepam (ATIVAN) 1 MG tablet Take 1 mg by mouth at bedtime as needed for anxiety or sleep.       . metoprolol succinate (TOPROL-XL) 25 MG 24 hr tablet Take 1 tablet (25 mg total) by mouth daily.  30 tablet  12  . sertraline (ZOLOFT) 100 MG tablet Take 100 mg by mouth  daily.       No current facility-administered medications for this visit.    History   Social History Narrative   Divorced mother of 2 with 3 grown children belong to her daughter who is in [place].   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 Has moved down from [place] to be near her son who lives in [place] (she actually lives with her son currently).   Former smoker who finally quit in 2000.   family history includes Cancer in her father and mother.  ROS: A comprehensive Review of Systems - was performed Review of Systems  Constitutional: Negative for fever, chills, weight loss, malaise/fatigue and diaphoresis.  HENT: Positive for congestion and sore  throat. Negative for nosebleeds.        Seasonal allergies with nasal/sinus congestion  Respiratory: Positive for shortness of breath. Negative for cough and wheezing.   Cardiovascular: Positive for palpitations. Negative for chest pain, orthopnea, claudication, leg swelling and PND.  Gastrointestinal: Positive for heartburn. Negative for constipation, blood in stool and melena.  Genitourinary: Negative for hematuria.  Neurological: Positive for dizziness and tingling. Negative for sensory change, speech change, focal weakness, seizures, loss of consciousness and headaches.  Endo/Heme/Allergies: Does not bruise/bleed easily.  All other systems reviewed and are negative.  PHYSICAL EXAM BP 115/68  Pulse 60  Ht 5\' 11"  (1.803 m)  Wt 190 lb 4 oz (86.297 kg)  BMI 26.55 kg/m2 General appearance: alert, cooperative, appears stated age, no distress and Essentially healthy appearing. HEENT: [place]/AT, L - EOMI but R EOM with weak adduction, MMM, anicteric sclera Neck: no adenopathy, no carotid bruit, no JVD, supple, symmetrical, trachea midline and thyroid not enlarged, symmetric, no tenderness/mass/nodules Lungs: clear to auscultation bilaterally, normal percussion bilaterally and Nonlabored, good air movement Heart: regular rate and rhythm, S1, S2 normal, no murmur, rub or gallop and normal apical impulse; possible soft midsystolic click Abdomen: soft, non-tender; bowel sounds normal; no masses,  no organomegaly Extremities: extremities normal, atraumatic, no cyanosis or edema Pulses: 2+ and symmetric Skin: Skin color, texture, turgor normal. No rashes or lesions Neurologic: Alert and oriented X 3, normal strength and tone. Normal symmetric reflexes. Normal coordination and gait   Adult ECG Report  Rate: 60 ;  Rhythm: normal sinus rhythm  QRS Axis: 27 ;  PR Interval: 160 ;  QRS Duration: 86 ; QTc: 434;  Voltages: Normal  Conduction Disturbances: none  Other Abnormalities: none  Narrative  Interpretation: Normal EKG  Recent Labs: TSH 2.28; Hgb 13.7  ASSESSMENT / PLAN: Otherwise relatively healthy middle-aged woman with no prior episodes of syncope and presented in single episode and found to be in SVT. There is a questionable midsystolic click noted on exam.  Near syncope - arrhythmogenic Thankfully, she was able to go to the emergency room while still in the supraventricular tachycardia that can easily explain the etiology of her near syncope. I explained to her that it would probably be in her best interest to avoid driving if she would have another episode, either having someone take her to the emergency room workup of that unit on EMS if she is feeling like she may pass out in order to potentially get treated while still in the field.  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 She doesn't drink a decent amount of the anesthetic and decongestant which could have triggered the episode. I recommended avoiding decongestant medicines that are oral in nature and not nasal spray.  Also recommended cutting back her caffeine intake. This is in addition to symptomatically hydrated.  As we have a confirmed diagnosis of SVT but no need to wear a monitor. She was very symptomatic when she had the episode. We discussed different vagal maneuvers to use.  Plan: continue with low-dose beta blocker; when necessary vagal maneuvers as well as sitting down or lying around. With midsystolic click, there is a question of possible mitral prolapse. Will check 2-D echocardiogram to ensure  no structural abnormalities.  Essential hypertension Well-controlled. If her pressures are lower on the beta blocker, may need to back off on the ACE inhibitor. This knee to avoid orthostatic changes with tachycardia.    Orders Placed This Encounter  Procedures  . EKG 12-Lead    Order Specific Question:  Where should this test be performed    Answer:   [facility]  . 2D Echocardiogram without contrast    Standing Status: Future     Number of Occurrences:      Standing Expiration Date: 02/18/2015    Order Specific Question:  Type of Echo    Answer:  Complete    Order Specific Question:  Where should this test be performed    Answer:  [facility]    Order Specific Question:  Reason for exam-Echo    Answer:  Syncope  780.2 / R55   Meds ordered this encounter  Medications  . metoprolol succinate (TOPROL-XL) 25 MG 24 hr tablet    Sig: Take 1 tablet (25 mg total) by mouth daily.    Dispense:  30 tablet    Refill:  12    Followup: 3 months  DAVID W. Herbie BaltimoreHARDING, M.D., M.S. Interventional Cardiolgy [facility]

## 2014-02-18 NOTE — Assessment & Plan Note (Signed)
Well-controlled. If her pressures are lower on the beta blocker, may need to back off on the ACE inhibitor. This knee to avoid orthostatic changes with tachycardia.

## 2014-02-18 NOTE — Assessment & Plan Note (Signed)
Thankfully, she was able to go to the emergency room while still in the supraventricular tachycardia that can easily explain the etiology of her near syncope. I explained to her that it would probably be in her best interest to avoid driving if she would have another episode, either having someone take her to the emergency room workup of that unit on EMS if she is feeling like she may pass out in order to potentially get treated while still in the field.

## 2014-02-19 ENCOUNTER — Telehealth: Payer: Self-pay

## 2014-02-19 NOTE — Telephone Encounter (Signed)
l [facility].

## 2014-02-26 ENCOUNTER — Ambulatory Visit (HOSPITAL_COMMUNITY)
Admission: RE | Admit: 2014-02-26 | Discharge: 2014-02-26 | Disposition: A | Discharge status: Home / Self Care | Payer: BC Managed Care – PPO | Source: Ambulatory Visit | Attending: Cardiology | Admitting: Cardiology

## 2014-02-26 DIAGNOSIS — R55 Syncope and collapse: Secondary | ICD-10-CM | POA: Diagnosis present

## 2014-02-26 DIAGNOSIS — I369 Nonrheumatic tricuspid valve disorder, unspecified: Secondary | ICD-10-CM

## 2014-02-26 NOTE — Progress Notes (Signed)
Quick Note:  Echo results: Good news: Essentially normal echocardiogram and normal pump function and normal valve function. No signs to suggest heart attack.. EF: 55-60%. No regional wall motion abnormalities  Gearldean Lomanto W, MD   ______ 

## 2014-02-26 NOTE — Progress Notes (Signed)
2D Echo Performed 02/26/2014    Leandrea Ackley, RCS  

## 2014-03-17 ENCOUNTER — Telehealth: Payer: Self-pay | Admitting: *Deleted

## 2014-03-17 NOTE — Telephone Encounter (Signed)
Request from [facility] [employer] , sent to [employer] on 03/17/14.

## 2014-03-17 NOTE — Telephone Encounter (Signed)
Request from [facility] , sent to [employer] on 03/17/14 .

## 2014-04-16 ENCOUNTER — Telehealth: Payer: Self-pay | Admitting: Cardiology

## 2014-04-16 NOTE — Telephone Encounter (Signed)
12.10.15 Received [employer] Disability and Leave form from [facility] @ [place].  Given to S. Daphine DeutscherMartin, RN for Dr Herbie BaltimoreHarding for him to review and sign. lp

## 2014-04-17 NOTE — Telephone Encounter (Signed)
This encounter was created in error - please disregard.

## 2014-04-20 NOTE — Telephone Encounter (Signed)
DISABILITY FORM SIGNED BY DR HARDING FORMS PLACED ON LYNN'S DESK

## 2014-04-21 NOTE — Telephone Encounter (Signed)
Received [employer] Disability and Leave form-[place] back from Dr Harding--completed and signed.

## 2014-04-21 NOTE — Telephone Encounter (Signed)
Faxed signed completed Disability/Leave form [place]) to [facility] 04/21/14. lp  Original form mailed to patient.  Unable to reach patient by phone. lp

## 2014-05-21 ENCOUNTER — Ambulatory Visit: Payer: Self-pay | Admitting: Cardiology

## 2014-06-15 ENCOUNTER — Other Ambulatory Visit: Payer: Self-pay | Admitting: Cardiology

## 2014-06-15 NOTE — Telephone Encounter (Signed)
Pt wants her Metoprolol ER changed,please call.

## 2014-06-15 NOTE — Telephone Encounter (Signed)
Fine - it doesn't work as well - equivalent would be Lopressor 12.5 mg bid.  DH

## 2014-06-15 NOTE — Telephone Encounter (Signed)
Pt requests that her medication be changed from Toprol-XL to Metoprolol Tartrate. She knows that she would have to take the medication BID. Patient spoke to her pharmacist and they told her the Metoprolol Tartrate is cheaper than the other medication. Pt also stated that she knows she needs to make an appointment, just waiting on her tax money so she can pay co-pay for appointment.

## 2014-06-16 MED ORDER — METOPROLOL TARTRATE 25 MG PO TABS: ORAL_TABLET | ORAL | Status: DC

## 2014-06-16 NOTE — Telephone Encounter (Signed)
Received call from patient Dr.Harding advised ok to take lopressor 12.5 mg twice a day.Stated she wanted metoprolol tart 25 mg better price.Refill sent to pharmacy.Advised she is past due for appointment with Dr.Harding.Stated she is on break and will call back to schedule appointment.

## 2014-06-16 NOTE — Telephone Encounter (Signed)
Left message to call back  

## 2014-06-26 IMAGING — CR DG WRIST COMPLETE 3+V*R*
4 series · 4 of 4 positions shown · non-contrast
Comparison: None.

CLINICAL DATA: Wrist pain after fall from outstretched arm.

RIGHT WRIST - COMPLETE 3+ VIEW

[x wrist pa right]
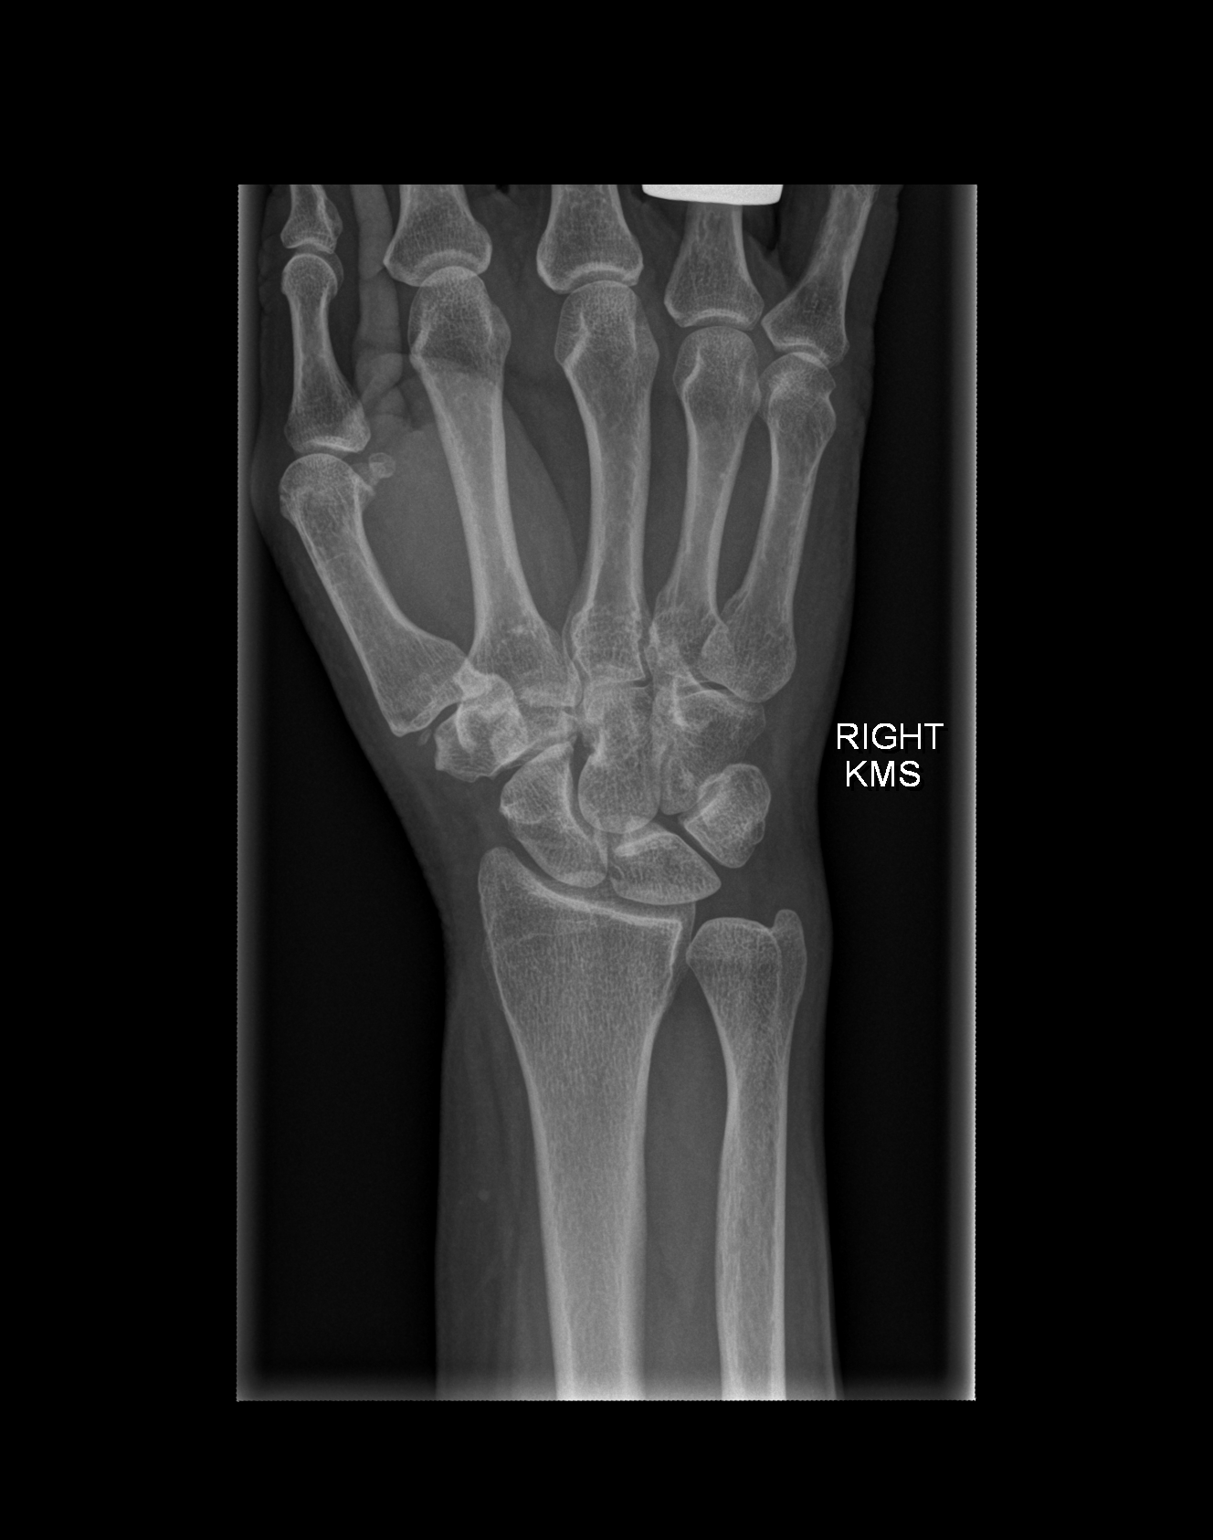

[x wrist obl right]
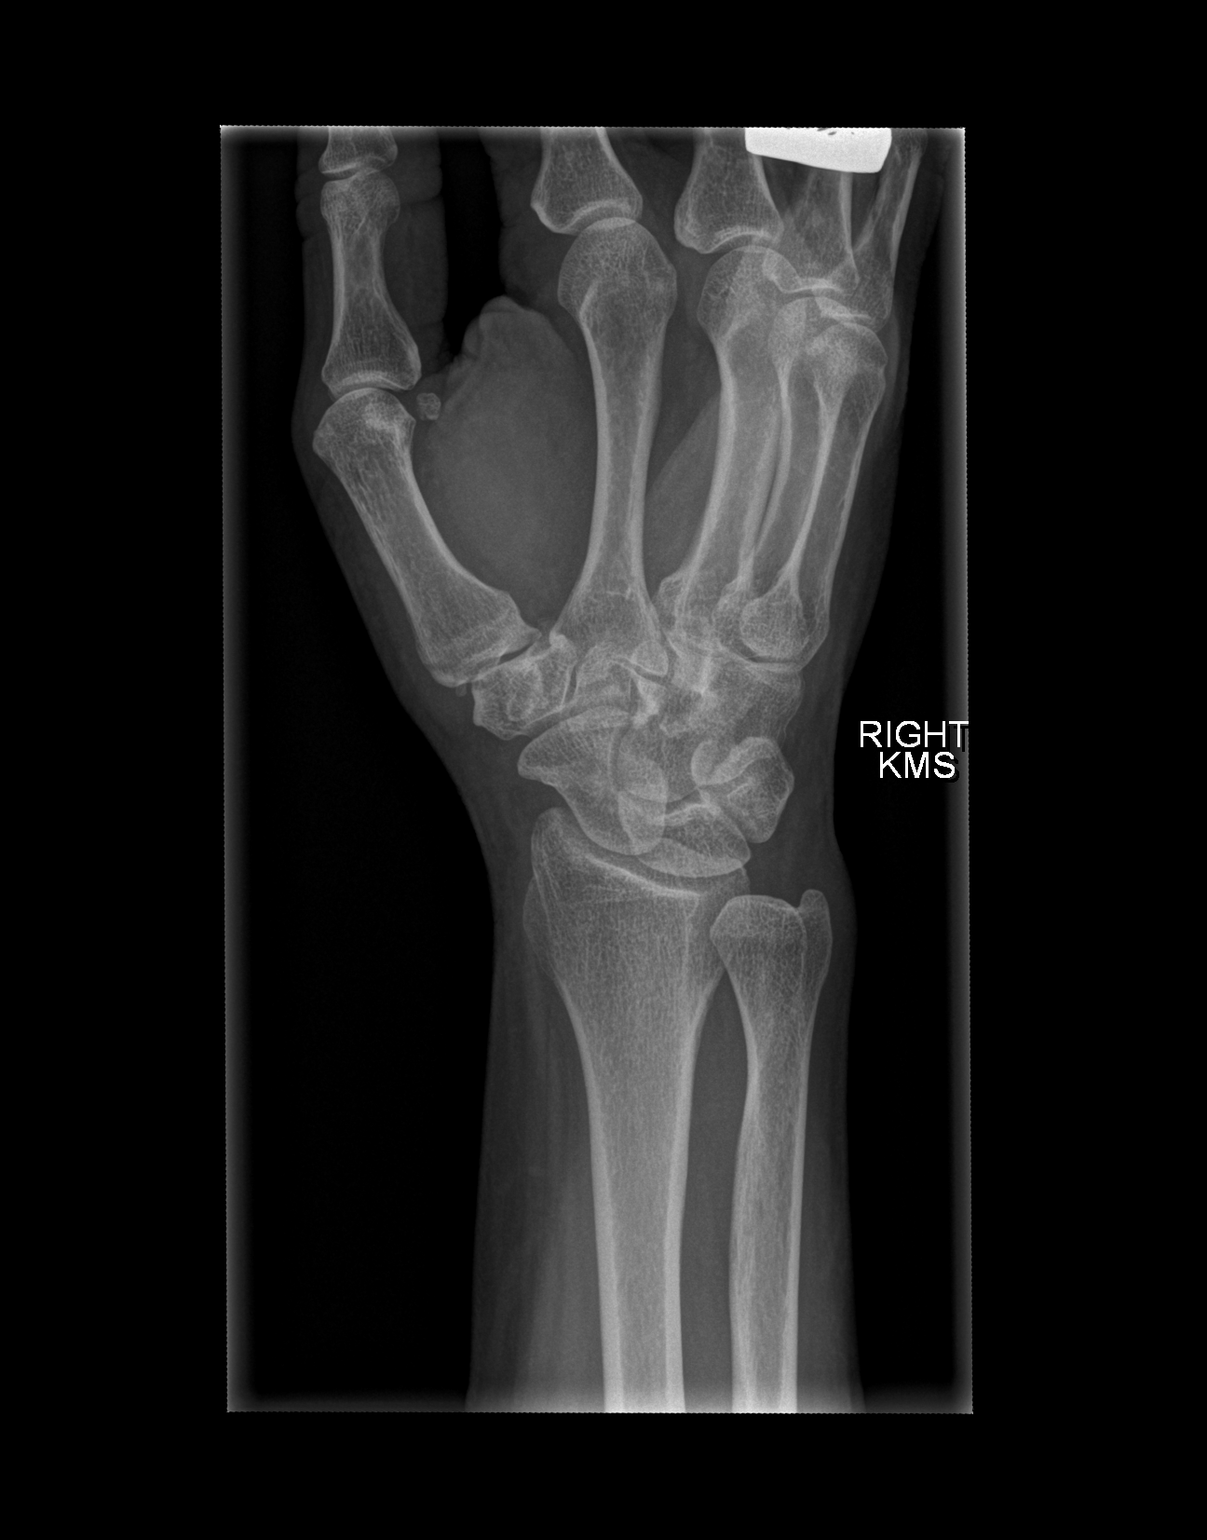

[x wrist lat right]
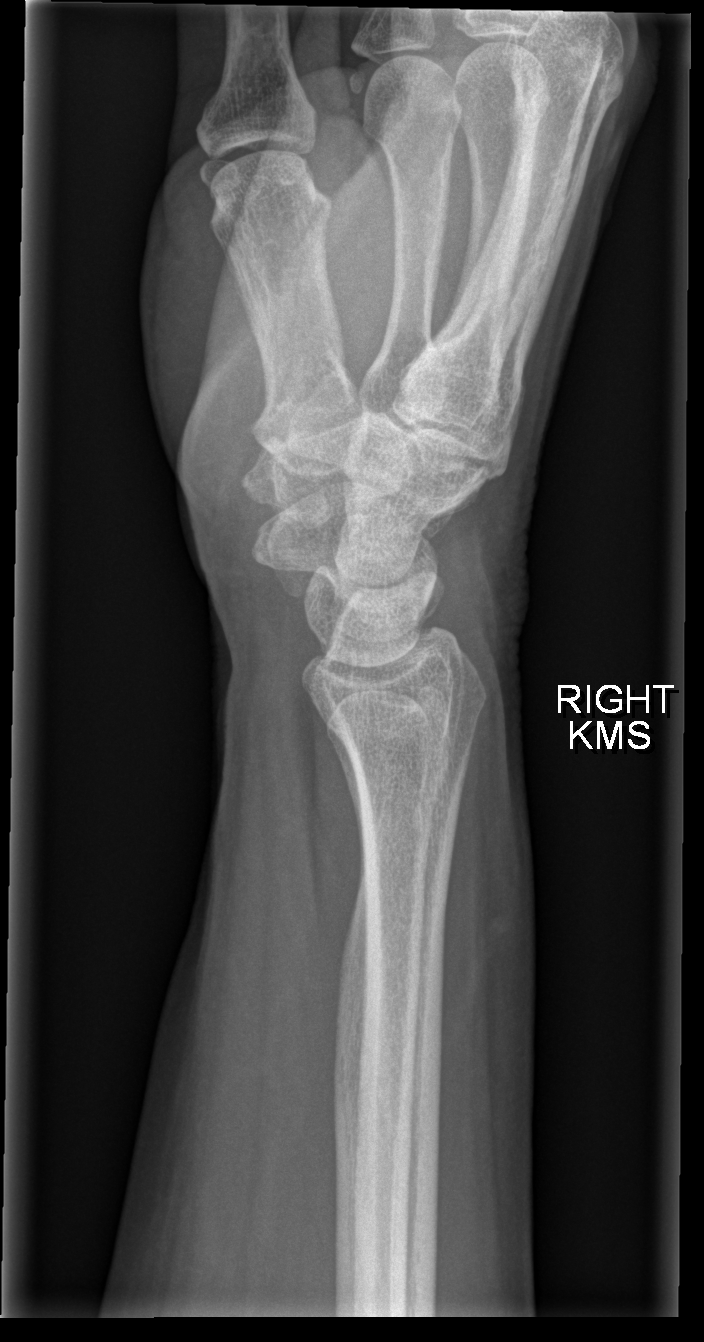

[x wrist navicular view right]
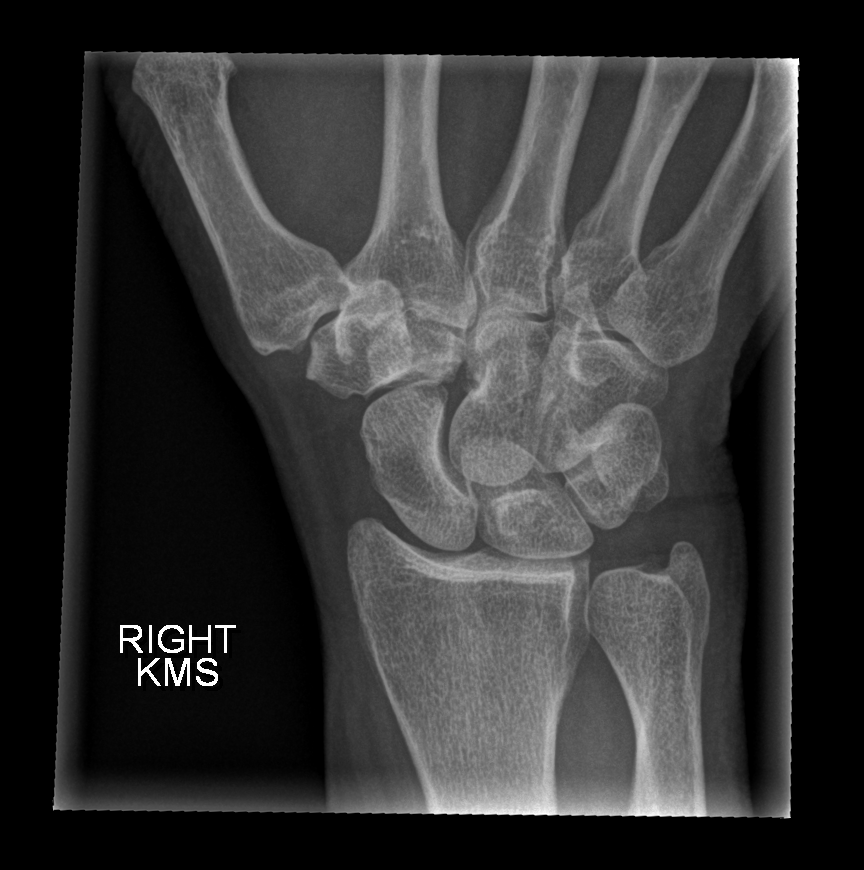

[4 of 4 positions shown; findings below may reference images not displayed]

FINDINGS: Degenerative changes in the STT and first carpometacarpal
joints with old ununited ossicle adjacent to the first
carpometacarpal joint.  The right wrist appears otherwise intact.
No evidence of acute fracture or subluxation.  No focal bone lesion
or bone destruction.  The bone cortex and trabecular architecture
appear intact.  No radiopaque soft tissue foreign bodies.
IMPRESSION: Degenerative changes in the right wrist.  No displaced fractures
identified.

## 2015-01-07 ENCOUNTER — Ambulatory Visit: Payer: Self-pay | Admitting: Cardiology

## 2016-07-01 ENCOUNTER — Encounter (HOSPITAL_COMMUNITY): Payer: Self-pay | Admitting: Emergency Medicine

## 2016-07-01 ENCOUNTER — Emergency Department (HOSPITAL_COMMUNITY)
Admission: EM | Admit: 2016-07-01 | Discharge: 2016-07-01 | Disposition: A | Discharge status: Home / Self Care | Payer: Medicaid - Out of State | Attending: Emergency Medicine | Admitting: Emergency Medicine

## 2016-07-01 DIAGNOSIS — I471 Supraventricular tachycardia: Secondary | ICD-10-CM | POA: Insufficient documentation

## 2016-07-01 DIAGNOSIS — Z87891 Personal history of nicotine dependence: Secondary | ICD-10-CM | POA: Insufficient documentation

## 2016-07-01 DIAGNOSIS — Z79899 Other long term (current) drug therapy: Secondary | ICD-10-CM | POA: Insufficient documentation

## 2016-07-01 DIAGNOSIS — I10 Essential (primary) hypertension: Secondary | ICD-10-CM | POA: Insufficient documentation

## 2016-07-01 LAB — BASIC METABOLIC PANEL
Anion gap: 9 (ref 5–15)
BUN: 26 mg/dL — ABNORMAL HIGH (ref 6–20)
CHLORIDE: 108 mmol/L (ref 101–111)
CO2: 20 mmol/L — ABNORMAL LOW (ref 22–32)
Calcium: 9 mg/dL (ref 8.9–10.3)
Creatinine, Ser: 1.01 mg/dL — ABNORMAL HIGH (ref 0.44–1.00)
GFR calc Af Amer: >60 mL/min (ref >60)
GFR calc non Af Amer: >60 mL/min (ref >60)
Glucose, Bld: 93 mg/dL (ref 65–99)
Potassium: 4.1 mmol/L (ref 3.5–5.1)
Sodium: 137 mmol/L (ref 135–145)

## 2016-07-01 LAB — CBC
HCT: 38.8 % (ref 36.0–46.0)
Hemoglobin: 13 g/dL (ref 12.0–15.0)
MCH: 28.8 pg (ref 26.0–34.0)
MCHC: 33.5 g/dL (ref 30.0–36.0)
MCV: 85.8 fL (ref 78.0–100.0)
PLATELETS: 285 10*3/uL (ref 150–400)
RBC: 4.52 MIL/uL (ref 3.87–5.11)
RDW: 13.5 % (ref 11.5–15.5)
WBC: 11.8 10*3/uL — AB (ref 4.0–10.5)

## 2016-07-01 LAB — I-STAT TROPONIN, ED: Troponin i, poc: 0 ng/mL (ref 0.00–0.08)

## 2016-07-01 LAB — TSH: TSH: 2.583 u[IU]/mL (ref 0.350–4.500)

## 2016-07-01 MED ORDER — METOPROLOL TARTRATE 5 MG/5ML IV SOLN
5.0000 mg | Freq: Once | INTRAVENOUS | Status: AC
  Administered 2016-07-01: 5 mg via INTRAVENOUS
  Filled 2016-07-01: qty 5

## 2016-07-01 NOTE — ED Notes (Signed)
Delay in med admin d/t pyxis being out of stock. Pharmacy called and will send dose.

## 2016-07-01 NOTE — ED Provider Notes (Signed)
[facility]-EMERGENCY DEPT Provider Note   CSN: [id number] Arrival date & time: 07/01/16  1406     History   Chief Complaint Chief Complaint  Patient presents with  . Tachycardia    HPI Adrienne Marsh is a 61 y.o. female.  Pt presents to the ED today with tachycardia.  She was at work and felt like her heart was racing.  She does have a hx of SVT that happened in 2015.  Pt said that she feels dizzy when she stands up.  It started about 2 hrs pta.      Past Medical History:  Diagnosis Date  . Diverticulitis   . Essential hypertension   . Hernia cerebri (HCC)   . Supraventricular tachycardia, paroxysmal (HCC) 02/09/2014   Presented with near syncope broke with adenosine in the ER -   . Uterine prolapse     Patient Active Problem List   Diagnosis Date Noted  . Supraventricular tachycardia (HCC) 02/18/2014  . Near syncope - arrhythmogenic 02/18/2014  . Essential hypertension 02/18/2014    Past Surgical History:  Procedure Laterality Date  . ABDOMINAL HYSTERECTOMY    . uterine prolapse repair      OB History    No data available       Home Medications    Prior to Admission medications   Medication Sig Start Date End Date Taking? Authorizing Provider  aluminum chloride (DRYSOL) 20 % external solution Apply 1 application topically at bedtime.   Yes Historical Provider, MD  esomeprazole (NEXIUM) 20 MG capsule Take 20 mg by mouth daily.   Yes Historical Provider, MD  hydrocortisone 1 % lotion Apply 1 application topically 2 (two) times daily.   Yes Historical Provider, MD  ibuprofen (ADVIL,MOTRIN) 200 MG tablet Take 400 mg by mouth every 6 (six) hours as needed for moderate pain.    Yes Historical Provider, MD  lisinopril (PRINIVIL,ZESTRIL) 10 MG tablet Take 10 mg by mouth daily.   Yes Historical Provider, MD  LORazepam (ATIVAN) 1 MG tablet Take 1 mg by mouth at bedtime as needed for anxiety or sleep.    Yes Historical Provider, MD  oxybutynin (DITROPAN) 5 MG tablet Take 5  mg by mouth daily. 05/29/16  Yes Historical Provider, MD  sertraline (ZOLOFT) 100 MG tablet Take 250 mg by mouth daily.    Yes Historical Provider, MD  metoprolol tartrate (LOPRESSOR) 25 MG tablet Take 25 mg 1/2 tablet twice a day Patient not taking: Reported on 07/01/2016 06/16/14   Marykay Lex, MD    Family History Family History  Problem Relation Age of Onset  . Cancer Mother   . Cancer Father     Social History Social History  Substance Use Topics  . Smoking status: Former Smoker    Packs/day: 1.00    Years: 10.00  . Smokeless tobacco: Not on file  . Alcohol use Yes     Comment: occasionally     Allergies   Codeine   Review of Systems Review of Systems  Cardiovascular: Positive for palpitations.  All other systems reviewed and are negative.    Physical Exam Updated Vital Signs BP 125/81 (BP Location: Right Arm)   Pulse (!) 123   Resp 13   SpO2 98%   Physical Exam  Constitutional: She is oriented to person, place, and time. She appears well-developed and well-nourished.  HENT:  Head: Normocephalic and atraumatic.  Right Ear: External ear normal.  Left Ear: External ear normal.  Nose: Nose normal.  Mouth/Throat: Oropharynx  is clear and moist.  Eyes: Conjunctivae and EOM are normal. Pupils are equal, round, and reactive to light.  Neck: Normal range of motion. Neck supple.  Cardiovascular: Regular rhythm.  Tachycardia present.   Pulmonary/Chest: Effort normal and breath sounds normal.  Abdominal: Soft. Bowel sounds are normal.  Musculoskeletal: Normal range of motion.  Neurological: She is alert and oriented to person, place, and time.  Skin: Skin is warm.  Psychiatric: She has a normal mood and affect. Her behavior is normal. Judgment and thought content normal.  Nursing note and vitals reviewed.    ED Treatments / Results  Labs (all labs ordered are listed, but only abnormal results are displayed) Labs Reviewed  BASIC METABOLIC PANEL - Abnormal;  Notable for the following:       Result Value   CO2 20 (*)    BUN 26 (*)    Creatinine, Ser 1.01 (*)    All other components within normal limits  CBC - Abnormal; Notable for the following:    WBC 11.8 (*)    All other components within normal limits  TSH  I-STAT TROPOININ, ED    EKG  EKG Interpretation  Date/Time:  Saturday July 01 2016 14:14:56 EST Ventricular Rate:  152 PR Interval:    QRS Duration: 82 QT Interval:  310 QTC Calculation: 493 R Axis:   49 Text Interpretation:  Sinus or ectopic atrial tachycardia Ventricular premature complex Aberrant conduction of SV complex(es) Consider right atrial enlargement ST depression, probably rate related Borderline prolonged QT interval similar to SVT in 2015 Confirmed by [facility]IL[facility] MD, Kristia Jupiter ([id number]) on 07/01/2016 2:28:18 PM       Radiology No results found.  Procedures Procedures (including critical care time)  Medications Ordered in ED Medications  metoprolol (LOPRESSOR) injection 5 mg (5 mg Intravenous Given 07/01/16 1503)     Initial Impression / Assessment and Plan / ED Course  I have reviewed the triage vital signs and the nursing notes.  Pertinent labs & imaging results that were available during my care of the patient were reviewed by me and considered in my medical decision making (see chart for details).    Pt's HR dropped to 120s without meds, so I gave her 5 mg of lopressor IV to help with heart rate.  That brought HR down into the 60s.  Pt feels much better.  She knows to f/u with pcp and with cardiology.  Return if worse.  Final Clinical Impressions(s) / ED Diagnoses   Final diagnoses:  SVT (supraventricular tachycardia) (HCC)    New Prescriptions New Prescriptions   No medications on file     Jacalyn LefevreJulie Berline Semrad, MD 07/01/16 1515

## 2016-07-01 NOTE — ED Triage Notes (Signed)
Per pt, states she started feeling her heart race at work about 2 1/2 hours ago-states she has a history of SVT and was converted by adenosine-states SOB and dizzy

## 2016-07-01 NOTE — ED Notes (Signed)
Bed: [id number] Expected date:  Expected time:  Means of arrival:  Comments: TRIAGE

## 2016-10-07 ENCOUNTER — Emergency Department (HOSPITAL_COMMUNITY)
Admission: EM | Admit: 2016-10-07 | Discharge: 2016-10-07 | Disposition: A | Discharge status: Home / Self Care | Payer: No Typology Code available for payment source | Attending: Emergency Medicine | Admitting: Emergency Medicine

## 2016-10-07 ENCOUNTER — Encounter (HOSPITAL_COMMUNITY): Payer: Self-pay

## 2016-10-07 ENCOUNTER — Emergency Department (HOSPITAL_COMMUNITY): Payer: No Typology Code available for payment source

## 2016-10-07 DIAGNOSIS — I479 Paroxysmal tachycardia, unspecified: Secondary | ICD-10-CM | POA: Diagnosis not present

## 2016-10-07 DIAGNOSIS — S161XXA Strain of muscle, fascia and tendon at neck level, initial encounter: Secondary | ICD-10-CM | POA: Insufficient documentation

## 2016-10-07 DIAGNOSIS — M25512 Pain in left shoulder: Secondary | ICD-10-CM | POA: Diagnosis not present

## 2016-10-07 DIAGNOSIS — M5430 Sciatica, unspecified side: Secondary | ICD-10-CM | POA: Diagnosis not present

## 2016-10-07 DIAGNOSIS — I10 Essential (primary) hypertension: Secondary | ICD-10-CM | POA: Insufficient documentation

## 2016-10-07 DIAGNOSIS — Y999 Unspecified external cause status: Secondary | ICD-10-CM | POA: Diagnosis not present

## 2016-10-07 DIAGNOSIS — M545 Low back pain, unspecified: Secondary | ICD-10-CM

## 2016-10-07 DIAGNOSIS — Y93I9 Activity, other involving external motion: Secondary | ICD-10-CM | POA: Diagnosis not present

## 2016-10-07 DIAGNOSIS — Y92410 Unspecified street and highway as the place of occurrence of the external cause: Secondary | ICD-10-CM | POA: Insufficient documentation

## 2016-10-07 DIAGNOSIS — M62838 Other muscle spasm: Secondary | ICD-10-CM

## 2016-10-07 DIAGNOSIS — M62830 Muscle spasm of back: Secondary | ICD-10-CM | POA: Diagnosis not present

## 2016-10-07 DIAGNOSIS — S1989XA Other specified injuries of other specified part of neck, initial encounter: Secondary | ICD-10-CM | POA: Diagnosis present

## 2016-10-07 DIAGNOSIS — M47812 Spondylosis without myelopathy or radiculopathy, cervical region: Secondary | ICD-10-CM

## 2016-10-07 MED ORDER — NAPROXEN 500 MG PO TABS
500.0000 mg | ORAL_TABLET | Freq: Once | ORAL | Status: AC
  Administered 2016-10-07: 500 mg via ORAL
  Filled 2016-10-07: qty 1

## 2016-10-07 MED ORDER — NAPROXEN 500 MG PO TABS: 500.0000 mg | ORAL_TABLET | Freq: Two times a day (BID) | ORAL | 0 refills | Status: DC | PRN

## 2016-10-07 MED ORDER — CYCLOBENZAPRINE HCL 10 MG PO TABS: 10.0000 mg | ORAL_TABLET | Freq: Three times a day (TID) | ORAL | 0 refills | Status: DC | PRN
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
## 2016-10-07 NOTE — Discharge Instructions (Signed)
Take naprosyn as directed for inflammation and pain with tylenol for breakthrough pain and flexeril for muscle relaxation. Do not drive or operate machinery with muscle relaxant use. Ice to areas of soreness for the next 24 hours and then may move to heat, no more than 20 minutes at a time every hour for each. Expect to be sore for the next few days and follow up with primary care physician for recheck of ongoing symptoms in the next 1-2 weeks. Return to ER for emergent changing or worsening of symptoms.  °  °
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
## 2017-02-06 ENCOUNTER — Other Ambulatory Visit: Payer: Self-pay | Admitting: Family Medicine

## 2017-02-07 ENCOUNTER — Other Ambulatory Visit: Payer: Self-pay | Admitting: Family Medicine

## 2017-02-07 DIAGNOSIS — Z1231 Encounter for screening mammogram for malignant neoplasm of breast: Secondary | ICD-10-CM

## 2017-08-25 ENCOUNTER — Encounter (HOSPITAL_COMMUNITY): Payer: Self-pay | Admitting: *Deleted

## 2017-08-25 ENCOUNTER — Other Ambulatory Visit: Payer: Self-pay

## 2017-08-25 ENCOUNTER — Emergency Department (HOSPITAL_COMMUNITY)
Admission: EM | Admit: 2017-08-25 | Discharge: 2017-08-25 | Disposition: A | Discharge status: Home / Self Care | Payer: BLUE CROSS/BLUE SHIELD | Attending: Emergency Medicine | Admitting: Emergency Medicine

## 2017-08-25 DIAGNOSIS — E860 Dehydration: Secondary | ICD-10-CM

## 2017-08-25 DIAGNOSIS — Z79899 Other long term (current) drug therapy: Secondary | ICD-10-CM | POA: Insufficient documentation

## 2017-08-25 DIAGNOSIS — I10 Essential (primary) hypertension: Secondary | ICD-10-CM | POA: Diagnosis not present

## 2017-08-25 DIAGNOSIS — N390 Urinary tract infection, site not specified: Secondary | ICD-10-CM | POA: Insufficient documentation

## 2017-08-25 DIAGNOSIS — Z87891 Personal history of nicotine dependence: Secondary | ICD-10-CM | POA: Diagnosis not present

## 2017-08-25 DIAGNOSIS — R197 Diarrhea, unspecified: Secondary | ICD-10-CM | POA: Diagnosis not present

## 2017-08-25 DIAGNOSIS — R112 Nausea with vomiting, unspecified: Secondary | ICD-10-CM

## 2017-08-25 LAB — COMPREHENSIVE METABOLIC PANEL
ALK PHOS: 94 U/L (ref 38–126)
ALT: 16 U/L (ref 14–54)
ANION GAP: 11 (ref 5–15)
AST: 21 U/L (ref 15–41)
Albumin: 3.9 g/dL (ref 3.5–5.0)
BUN: 37 mg/dL — ABNORMAL HIGH (ref 6–20)
CALCIUM: 8.6 mg/dL — AB (ref 8.9–10.3)
CO2: 17 mmol/L — AB (ref 22–32)
Chloride: 109 mmol/L (ref 101–111)
Creatinine, Ser: 1.07 mg/dL — ABNORMAL HIGH (ref 0.44–1.00)
GFR calc non Af Amer: 56 mL/min — ABNORMAL LOW (ref >60)
Glucose, Bld: 103 mg/dL — ABNORMAL HIGH (ref 65–99)
POTASSIUM: 3.5 mmol/L (ref 3.5–5.1)
Sodium: 137 mmol/L (ref 135–145)
TOTAL PROTEIN: 7.6 g/dL (ref 6.5–8.1)
Total Bilirubin: 0.5 mg/dL (ref 0.3–1.2)

## 2017-08-25 LAB — CBC
HEMATOCRIT: 37.8 % (ref 36.0–46.0)
HEMOGLOBIN: 12.3 g/dL (ref 12.0–15.0)
MCH: 27 pg (ref 26.0–34.0)
MCHC: 32.5 g/dL (ref 30.0–36.0)
MCV: 82.9 fL (ref 78.0–100.0)
Platelets: 301 10*3/uL (ref 150–400)
RBC: 4.56 MIL/uL (ref 3.87–5.11)
RDW: 15.1 % (ref 11.5–15.5)
WBC: 7.1 10*3/uL (ref 4.0–10.5)

## 2017-08-25 LAB — URINALYSIS, ROUTINE W REFLEX MICROSCOPIC
Bilirubin Urine: NEGATIVE
GLUCOSE, UA: NEGATIVE mg/dL
Ketones, ur: 5 mg/dL — AB
NITRITE: POSITIVE — AB
Protein, ur: 30 mg/dL — AB
SPECIFIC GRAVITY, URINE: 1.017 (ref 1.005–1.030)
pH: 5 (ref 5.0–8.0)

## 2017-08-25 LAB — I-STAT BETA HCG BLOOD, ED (MC, WL, AP ONLY): I-stat hCG, quantitative: <5 m[IU]/mL (ref <5)

## 2017-08-25 LAB — LIPASE, BLOOD: Lipase: 23 U/L (ref 11–51)

## 2017-08-25 MED ORDER — CIPROFLOXACIN HCL 500 MG PO TABS: 500.0000 mg | ORAL_TABLET | Freq: Two times a day (BID) | ORAL | 0 refills | Status: DC

## 2017-08-25 MED ORDER — SODIUM CHLORIDE 0.9 % IV BOLUS
1000.0000 mL | Freq: Once | INTRAVENOUS | Status: AC
  Administered 2017-08-25: 1000 mL via INTRAVENOUS

## 2017-08-25 MED ORDER — METOCLOPRAMIDE HCL 10 MG PO TABS: 10.0000 mg | ORAL_TABLET | Freq: Four times a day (QID) | ORAL | 0 refills | Status: DC | PRN

## 2017-08-25 MED ORDER — CIPROFLOXACIN IN D5W 400 MG/200ML IV SOLN
400.0000 mg | Freq: Once | INTRAVENOUS | Status: AC
  Administered 2017-08-25: 400 mg via INTRAVENOUS
  Filled 2017-08-25: qty 200

## 2017-08-25 MED ORDER — ONDANSETRON HCL 4 MG/2ML IJ SOLN
4.0000 mg | Freq: Once | INTRAMUSCULAR | Status: AC
  Administered 2017-08-25: 4 mg via INTRAVENOUS
  Filled 2017-08-25: qty 2

## 2017-08-25 MED ORDER — CIPROFLOXACIN HCL 500 MG PO TABS
500.0000 mg | ORAL_TABLET | Freq: Two times a day (BID) | ORAL | Status: DC
  Administered 2017-08-25: 500 mg via ORAL
  Filled 2017-08-25: qty 1

## 2017-08-25 NOTE — ED Notes (Signed)
Pt ambulated to the restroom with a steady gait. Instructed to retrieve a urine sample.

## 2017-08-25 NOTE — ED Triage Notes (Signed)
Pt states she has had virus symptoms for a couple of days, n/V/D, today she has been able to keep a little food in.

## 2017-08-25 NOTE — ED Provider Notes (Signed)
[facility]-EMERGENCY DEPT Provider Note   CSN: [id number] Arrival date & time: 08/25/17  1653     History   Chief Complaint Chief Complaint  Patient presents with  . Emesis  . Nausea  . Diarrhea    HPI Adrienne Marsh is a 62 y.o. female history of hypertension, diverticulitis, here presenting with chills, diarrhea, vomiting.  Patient states that she works as a Conservation officer, nature in [employer].  She states that for the last 3 days she has been having some nausea vomiting and diarrhea.  She states that she has some subjective chills yesterday.  Today, she states that she has better appetite and she was able to keep some fluids down but felt nauseated.  She still has persistent diarrhea.  She denies any recent travel or uncooked food. No recent abx or hx of C diff.   The history is provided by the patient.    Past Medical History:  Diagnosis Date  . Diverticulitis   . Essential hypertension   . Hernia cerebri (HCC)   . Supraventricular tachycardia, paroxysmal (HCC) 02/09/2014   Presented with near syncope broke with adenosine in the ER -   . Uterine prolapse     Patient Active Problem List   Diagnosis Date Noted  . Supraventricular tachycardia (HCC) 02/18/2014  . Near syncope - arrhythmogenic 02/18/2014  . Essential hypertension 02/18/2014    Past Surgical History:  Procedure Laterality Date  . ABDOMINAL HYSTERECTOMY    . ABDOMINAL SURGERY    . uterine prolapse repair       OB History   None      Home Medications    Prior to Admission medications   Medication Sig Start Date End Date Taking? Authorizing Provider  aluminum chloride (DRYSOL) 20 % external solution Apply 1 application topically at bedtime.    [provider]  cyclobenzaprine (FLEXERIL) 10 MG tablet Take 1 tablet (10 mg total) by mouth 3 (three) times daily as needed for muscle spasms. 10/07/16   Street, Mercedes, PA-C  esomeprazole (NEXIUM) 20 MG capsule Take 20 mg by mouth daily.     [provider]  hydrocortisone 1 % lotion Apply 1 application topically 2 (two) times daily.    [provider]  ibuprofen (ADVIL,MOTRIN) 200 MG tablet Take 400 mg by mouth every 6 (six) hours as needed for moderate pain.     [provider]  lisinopril (PRINIVIL,ZESTRIL) 10 MG tablet Take 10 mg by mouth daily.    [provider]  LORazepam (ATIVAN) 1 MG tablet Take 1 mg by mouth at bedtime as needed for anxiety or sleep.     [provider]  metoprolol tartrate (LOPRESSOR) 25 MG tablet Take 25 mg 1/2 tablet twice a day Patient not taking: Reported on 07/01/2016 06/16/14   Marykay Lex, MD  naproxen (NAPROSYN) 500 MG tablet Take 1 tablet (500 mg total) by mouth 2 (two) times daily as needed for mild pain, moderate pain or headache (TAKE WITH MEALS.). 10/07/16   Street, [place], PA-C  oxybutynin (DITROPAN) 5 MG tablet Take 5 mg by mouth daily. 05/29/16   [provider]  sertraline (ZOLOFT) 100 MG tablet Take 250 mg by mouth daily.     [provider]    Family History Family History  Problem Relation Age of Onset  . Cancer Mother   . Cancer Father     Social History Social History   Tobacco Use  . Smoking status: Former Smoker  Packs/day: 1.00    Years: 10.00    Pack years: 10.00  . Smokeless tobacco: Never Used  Substance Use Topics  . Alcohol use: Yes    Comment: occasionally  . Drug use: No     Allergies   Codeine   Review of Systems Review of Systems  Gastrointestinal: Positive for diarrhea and vomiting.  All other systems reviewed and are negative.    Physical Exam Updated Vital Signs BP 138/70 (BP Location: Left Arm)   Pulse 70   Temp 98.3 F (36.8 C) (Oral)   Resp 17   Ht 5\' 10"  (1.778 m)   SpO2 96%   BMI 29.06 kg/m   Physical Exam  Constitutional: She is oriented to person, place, and time.  Slightly nauseated   HENT:  Head: Normocephalic.  MM slightly dry   Eyes: Pupils are equal,  round, and reactive to light. Conjunctivae and EOM are normal.  Neck: Normal range of motion. Neck supple.  Cardiovascular: Normal rate, regular rhythm and normal heart sounds.  Pulmonary/Chest: Effort normal and breath sounds normal. No stridor. No respiratory distress. She has no wheezes.  Abdominal: Soft. Bowel sounds are normal. She exhibits no distension. There is no tenderness. There is no guarding.  Musculoskeletal: Normal range of motion.  Neurological: She is alert and oriented to person, place, and time. No cranial nerve deficit. Coordination normal.  Skin: Skin is warm.  Psychiatric: She has a normal mood and affect.  Nursing note and vitals reviewed.    ED Treatments / Results  Labs (all labs ordered are listed, but only abnormal results are displayed) Labs Reviewed  COMPREHENSIVE METABOLIC PANEL - Abnormal; Notable for the following components:      Result Value   CO2 17 (*)    Glucose, Bld 103 (*)    BUN 37 (*)    Creatinine, Ser 1.07 (*)    Calcium 8.6 (*)    GFR calc non Af Amer 56 (*)    All other components within normal limits  URINALYSIS, ROUTINE W REFLEX MICROSCOPIC - Abnormal; Notable for the following components:   APPearance CLOUDY (*)    Hgb urine dipstick SMALL (*)    Ketones, ur 5 (*)    Protein, ur 30 (*)    Nitrite POSITIVE (*)    Leukocytes, UA LARGE (*)    Bacteria, UA MANY (*)    Squamous Epithelial / LPF 0-5 (*)    All other components within normal limits  LIPASE, BLOOD  CBC  I-STAT BETA HCG BLOOD, ED ([facility], [facility], [facility] ONLY)    EKG None  Radiology No results found.  Procedures Procedures (including critical care time)  Medications Ordered in ED Medications  ciprofloxacin (CIPRO) tablet 500 mg (has no administration in time range)  sodium chloride 0.9 % bolus 1,000 mL (1,000 mLs Intravenous New Bag/Given 08/25/17 2135)  ondansetron (ZOFRAN) injection 4 mg (4 mg Intravenous Given 08/25/17 2135)  ciprofloxacin (CIPRO) IVPB 400 mg (400 mg  Intravenous New Bag/Given 08/25/17 2151)     Initial Impression / Assessment and Plan / ED Course  I have reviewed the triage vital signs and the nursing notes.  Pertinent labs & imaging results that were available during my care of the patient were reviewed by me and considered in my medical decision making (see chart for details).     Adrienne Marsh is a 62 y.o. female here with abdominal pain, chills. Likely gastroenteritis. No hx of C diff and no previous hx of SBO.  Will check labs, UA. Will hydrate and reassess.   11:07 PM Labs showed bicarb 17, likely from dehydration. UA + leuk and too many to count WBC. She can have contaminated urine from gastro vs UTI. Given cipro, which can treat UTI and help with most infectious etiologies for gastro. Tolerated PO in the ED. Stable for discharge.   Final Clinical Impressions(s) / ED Diagnoses   Final diagnoses:  None    ED Discharge Orders    None       Charlynne Pander, MD 08/25/17 2308

## 2017-08-25 NOTE — Discharge Instructions (Signed)
Take cipro twice daily for 5 days.   Take reglan as needed for nausea or vomiting.   Stay hydrated.   See your doctor  Return to ER if you have persistent vomiting, abdominal pain, fever, worse dehydration

## 2017-08-25 NOTE — ED Notes (Signed)
Patient unable to give urine sample at this time

## 2018-03-22 ENCOUNTER — Ambulatory Visit: Payer: Self-pay | Admitting: Psychiatry

## 2018-03-26 ENCOUNTER — Encounter: Payer: Self-pay | Admitting: Emergency Medicine

## 2018-03-26 DIAGNOSIS — F419 Anxiety disorder, unspecified: Secondary | ICD-10-CM | POA: Insufficient documentation

## 2018-03-26 DIAGNOSIS — F329 Major depressive disorder, single episode, unspecified: Secondary | ICD-10-CM | POA: Insufficient documentation

## 2018-03-26 DIAGNOSIS — F4310 Post-traumatic stress disorder, unspecified: Secondary | ICD-10-CM | POA: Insufficient documentation

## 2018-03-26 DIAGNOSIS — F32A Depression, unspecified: Secondary | ICD-10-CM | POA: Insufficient documentation

## 2018-03-27 ENCOUNTER — Ambulatory Visit: Payer: Self-pay | Admitting: Psychiatry

## 2018-03-27 ENCOUNTER — Ambulatory Visit (INDEPENDENT_AMBULATORY_CARE_PROVIDER_SITE_OTHER): Payer: BLUE CROSS/BLUE SHIELD | Admitting: Psychiatry

## 2018-03-27 DIAGNOSIS — F32A Depression, unspecified: Secondary | ICD-10-CM

## 2018-03-27 DIAGNOSIS — F4310 Post-traumatic stress disorder, unspecified: Secondary | ICD-10-CM | POA: Diagnosis not present

## 2018-03-27 DIAGNOSIS — F419 Anxiety disorder, unspecified: Secondary | ICD-10-CM | POA: Diagnosis not present

## 2018-03-27 DIAGNOSIS — F329 Major depressive disorder, single episode, unspecified: Secondary | ICD-10-CM | POA: Diagnosis not present

## 2018-03-27 MED ORDER — SERTRALINE HCL 100 MG PO TABS: 200.0000 mg | ORAL_TABLET | Freq: Every day | ORAL | 3 refills | Status: DC

## 2018-03-27 NOTE — Progress Notes (Signed)
[facility]  Patient ID: Adrienne Marsh,  MRN: [id number]  PCP: Devra Dopp, MD  Date of Evaluation: 03/27/2018 Time spent:20 minutes  Chief Complaint:   HISTORY/CURRENT STATUS: HPI 62 year old white female last seen August of this year.  He is a diagnosis of anxiety with panic attacks, depression, PTSD.  At her last visit she did have some anxiety but no recent panic attacks.  Depression was present.  Also having dreams once or twice a month but no flashbacks.  Startles easily.  Patient was also having paranoia that people were following her but she at this time did not want to treat she thought a lot of it was due to her PTSD.  Medications same per patient request. Overall the patient is doing okay. anxiety.  PTSD with rare flashbacks, some nightmares.  Depression is not bad no suicidal thoughts  Individual Medical History/ Review of Systems: Changes? :No   Allergies: Codeine  Current Medications:  Current Outpatient Medications:  .  aluminum chloride (DRYSOL) 20 % external solution, Apply 1 application topically at bedtime., Disp: , Rfl:  .  ciprofloxacin (CIPRO) 500 MG tablet, Take 1 tablet (500 mg total) by mouth every 12 (twelve) hours., Disp: 10 tablet, Rfl: 0 .  cyclobenzaprine (FLEXERIL) 10 MG tablet, Take 1 tablet (10 mg total) by mouth 3 (three) times daily as needed for muscle spasms., Disp: 15 tablet, Rfl: 0 .  esomeprazole (NEXIUM) 20 MG capsule, Take 20 mg by mouth daily., Disp: , Rfl:  .  hydrocortisone 1 % lotion, Apply 1 application topically 2 (two) times daily., Disp: , Rfl:  .  ibuprofen (ADVIL,MOTRIN) 200 MG tablet, Take 400 mg by mouth every 6 (six) hours as needed for moderate pain. , Disp: , Rfl:  .  lisinopril (PRINIVIL,ZESTRIL) 10 MG tablet, Take 10 mg by mouth daily., Disp: , Rfl:  .  metoCLOPramide (REGLAN) 10 MG tablet, Take 1 tablet (10 mg total) by mouth every 6 (six) hours as needed for nausea (nausea/headache)., Disp: 10 tablet, Rfl: 0 .   metoprolol tartrate (LOPRESSOR) 25 MG tablet, Take 25 mg 1/2 tablet twice a day (Patient not taking: Reported on 07/01/2016), Disp: 30 tablet, Rfl: 2 .  naproxen (NAPROSYN) 500 MG tablet, Take 1 tablet (500 mg total) by mouth 2 (two) times daily as needed for mild pain, moderate pain or headache (TAKE WITH MEALS.)., Disp: 20 tablet, Rfl: 0 .  oxybutynin (DITROPAN) 5 MG tablet, Take 5 mg by mouth daily., Disp: , Rfl: 1 Medication Side Effects: none  Family Medical/ Social History: Changes? No  MENTAL HEALTH EXAM:  There were no vitals taken for this visit.There is no height or weight on file to calculate BMI.  General Appearance: Casual  Eye Contact:  Good  Speech:  Clear and Coherent  Volume:  Normal  Mood:  Depressed  Affect:  Appropriate  Thought Process:  Linear  Orientation:  Full (Time, Place, and Person)  Thought Content: Logical   Suicidal Thoughts:  No  Homicidal Thoughts:  No  Memory:  WNL  Judgement:  Good  Insight:  Good  Psychomotor Activity:  Normal  Concentration:  Concentration: Good and Attention Span: Good  Recall:  Good  Fund of Knowledge: Good  Language: Good  Assets:  Desire for Improvement  ADL's:  Intact  Cognition: WNL  Prognosis:  Good    DIAGNOSES:    ICD-10-CM   1. Anxiety F41.9   2. PTSD (post-traumatic stress disorder) F43.10   3. Depression, unspecified depression type  F32.9     Receiving Psychotherapy: No    RECOMMENDATIONS: We will continue Ativan 1 mg in the morning and 2 at night.  Zoloft 100 mg 2 a day.  BuSpar 15 mg 1 a day for a week and then 2 a day.  See her back in 6 weeks. Hx of SVT, not recently Had to handwrite her Ativan and her BuSpar.  Both medications with 3 refills.   Anne Fulay Rommie Dunn, PA-C

## 2018-04-15 ENCOUNTER — Ambulatory Visit: Payer: Self-pay | Admitting: Psychiatry

## 2018-04-18 ENCOUNTER — Ambulatory Visit: Payer: Self-pay | Admitting: Psychiatry

## 2018-05-06 ENCOUNTER — Ambulatory Visit: Payer: BLUE CROSS/BLUE SHIELD | Admitting: Psychiatry
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
## 2018-06-03 ENCOUNTER — Ambulatory Visit: Payer: BLUE CROSS/BLUE SHIELD | Admitting: Psychiatry

## 2018-07-13 ENCOUNTER — Other Ambulatory Visit: Payer: Self-pay | Admitting: Psychiatry

## 2018-07-15 ENCOUNTER — Ambulatory Visit: Payer: BLUE CROSS/BLUE SHIELD | Admitting: Psychiatry

## 2018-07-15 DIAGNOSIS — F4310 Post-traumatic stress disorder, unspecified: Secondary | ICD-10-CM

## 2018-07-15 DIAGNOSIS — F411 Generalized anxiety disorder: Secondary | ICD-10-CM | POA: Diagnosis not present

## 2018-07-15 MED ORDER — BUSPIRONE HCL 15 MG PO TABS: ORAL_TABLET | ORAL | 2 refills | Status: DC

## 2018-07-15 MED ORDER — SERTRALINE HCL 100 MG PO TABS: 200.0000 mg | ORAL_TABLET | Freq: Every day | ORAL | 3 refills | Status: AC

## 2018-07-15 MED ORDER — LORAZEPAM 1 MG PO TABS: 1.0000 mg | ORAL_TABLET | Freq: Three times a day (TID) | ORAL | 0 refills | Status: DC

## 2018-07-15 NOTE — Telephone Encounter (Signed)
HAS OV TODAY

## 2018-07-15 NOTE — Progress Notes (Signed)
[facility]  Patient ID: Adrienne Marsh,  MRN: [id number]  PCP: Devra Dopp, MD  Date of Evaluation: 07/15/2018 Time spent:20 minutes  Chief Complaint:   HISTORY/CURRENT STATUS: HPI patient seen back in November of last year.  She had depression PTSD.  She is also having some paranoia.  Since that last visit she is doing better still has some paranoia but she is anxious from her PTSD.  At the last visit we started BuSpar  Individual Medical History/ Review of Systems: Changes? :No   Allergies: Codeine  Current Medications:  Current Outpatient Medications:  .  aluminum chloride (DRYSOL) 20 % external solution, Apply 1 application topically at bedtime., Disp: , Rfl:  .  ciprofloxacin (CIPRO) 500 MG tablet, Take 1 tablet (500 mg total) by mouth every 12 (twelve) hours., Disp: 10 tablet, Rfl: 0 .  cyclobenzaprine (FLEXERIL) 10 MG tablet, Take 1 tablet (10 mg total) by mouth 3 (three) times daily as needed for muscle spasms., Disp: 15 tablet, Rfl: 0 .  esomeprazole (NEXIUM) 20 MG capsule, Take 20 mg by mouth daily., Disp: , Rfl:  .  hydrocortisone 1 % lotion, Apply 1 application topically 2 (two) times daily., Disp: , Rfl:  .  ibuprofen (ADVIL,MOTRIN) 200 MG tablet, Take 400 mg by mouth every 6 (six) hours as needed for moderate pain. , Disp: , Rfl:  .  lisinopril (PRINIVIL,ZESTRIL) 10 MG tablet, Take 10 mg by mouth daily., Disp: , Rfl:  .  metoCLOPramide (REGLAN) 10 MG tablet, Take 1 tablet (10 mg total) by mouth every 6 (six) hours as needed for nausea (nausea/headache)., Disp: 10 tablet, Rfl: 0 .  metoprolol tartrate (LOPRESSOR) 25 MG tablet, Take 25 mg 1/2 tablet twice a day (Patient not taking: Reported on 07/01/2016), Disp: 30 tablet, Rfl: 2 .  naproxen (NAPROSYN) 500 MG tablet, Take 1 tablet (500 mg total) by mouth 2 (two) times daily as needed for mild pain, moderate pain or headache (TAKE WITH MEALS.)., Disp: 20 tablet, Rfl: 0 .  oxybutynin (DITROPAN) 5 MG tablet, Take 5  mg by mouth daily., Disp: , Rfl: 1 .  sertraline (ZOLOFT) 100 MG tablet, Take 2 tablets (200 mg total) by mouth daily., Disp: 60 tablet, Rfl: 3 Medication Side Effects: none  Family Medical/ Social History: Changes? No  MENTAL HEALTH EXAM:  There were no vitals taken for this visit.There is no height or weight on file to calculate BMI.  General Appearance: Casual  Eye Contact:  Good  Speech:  Clear and Coherent  Volume:  Normal  Mood:  Depressed  Affect:  Appropriate  Thought Process:  Linear  Orientation:  Full (Time, Place, and Person)  Thought Content: Logical   Suicidal Thoughts:  No  Homicidal Thoughts:  No  Memory:  WNL  Judgement:  Good  Insight:  Good  Psychomotor Activity:  Increased  Concentration:  Concentration: Good  Recall:  Good  Fund of Knowledge: Good  Language: Good  Assets:  Desire for Improvement  ADL's:  Intact  Cognition: WNL  Prognosis:  Fair    DIAGNOSES: No diagnosis found.  Receiving Psychotherapy: No    RECOMMENDATIONS: Patient is overall doing the same.  She says she thinks the paranoia is part of her PTSD.  She feels her depression is a little bit better.  She is losing her insurance and has to be seen every 3 months.  I have told her we can do that but she must call if she is getting worse.  Or keep  her on Ativan 1 mg in the morning and 2 at night, BuSpar 15 mg twice a day.  I will see her again in 3 months.  She does have a friend she talks to (I asked her about counseling).  Discussed exercise.   Anne Fu, PA-C

## 2018-08-21 ENCOUNTER — Encounter (HOSPITAL_COMMUNITY): Payer: Self-pay | Admitting: Emergency Medicine

## 2018-08-21 ENCOUNTER — Emergency Department (HOSPITAL_COMMUNITY)
Admission: EM | Admit: 2018-08-21 | Discharge: 2018-08-21 | Disposition: A | Discharge status: Home / Self Care | Payer: BLUE CROSS/BLUE SHIELD | Attending: Emergency Medicine | Admitting: Emergency Medicine

## 2018-08-21 ENCOUNTER — Other Ambulatory Visit: Payer: Self-pay

## 2018-08-21 DIAGNOSIS — I10 Essential (primary) hypertension: Secondary | ICD-10-CM | POA: Insufficient documentation

## 2018-08-21 DIAGNOSIS — R000 Tachycardia, unspecified: Secondary | ICD-10-CM

## 2018-08-21 DIAGNOSIS — Z79899 Other long term (current) drug therapy: Secondary | ICD-10-CM | POA: Insufficient documentation

## 2018-08-21 DIAGNOSIS — F419 Anxiety disorder, unspecified: Secondary | ICD-10-CM | POA: Insufficient documentation

## 2018-08-21 DIAGNOSIS — F329 Major depressive disorder, single episode, unspecified: Secondary | ICD-10-CM | POA: Diagnosis not present

## 2018-08-21 DIAGNOSIS — Z87891 Personal history of nicotine dependence: Secondary | ICD-10-CM | POA: Diagnosis not present

## 2018-08-21 DIAGNOSIS — R002 Palpitations: Secondary | ICD-10-CM | POA: Diagnosis present

## 2018-08-21 LAB — BASIC METABOLIC PANEL
Anion gap: 8 (ref 5–15)
BUN: 26 mg/dL — ABNORMAL HIGH (ref 6–20)
CO2: 23 mmol/L (ref 22–32)
Calcium: 9.4 mg/dL (ref 8.9–10.3)
Chloride: 108 mmol/L (ref 98–111)
Creatinine, Ser: 0.91 mg/dL (ref 0.44–1.00)
GFR calc Af Amer: >60 mL/min (ref >60)
GFR calc non Af Amer: >60 mL/min (ref >60)
Glucose, Bld: 97 mg/dL (ref 70–99)
Potassium: 5 mmol/L (ref 3.5–5.1)
Sodium: 139 mmol/L (ref 135–145)

## 2018-08-21 LAB — CBC WITH DIFFERENTIAL/PLATELET
Abs Immature Granulocytes: 0.04 10*3/uL (ref 0.00–0.07)
Basophils Absolute: 0.1 10*3/uL (ref 0.0–0.1)
Basophils Relative: 1 %
Eosinophils Absolute: 0.6 10*3/uL — ABNORMAL HIGH (ref 0.0–0.5)
Eosinophils Relative: 5 %
HCT: 40.5 % (ref 36.0–46.0)
Hemoglobin: 13.1 g/dL (ref 12.0–15.0)
Immature Granulocytes: 0 %
Lymphocytes Relative: 23 %
Lymphs Abs: 2.7 10*3/uL (ref 0.7–4.0)
MCH: 29 pg (ref 26.0–34.0)
MCHC: 32.3 g/dL (ref 30.0–36.0)
MCV: 89.8 fL (ref 80.0–100.0)
Monocytes Absolute: 0.8 10*3/uL (ref 0.1–1.0)
Monocytes Relative: 6 %
Neutro Abs: 8 10*3/uL — ABNORMAL HIGH (ref 1.7–7.7)
Neutrophils Relative %: 65 %
Platelets: 287 10*3/uL (ref 150–400)
RBC: 4.51 MIL/uL (ref 3.87–5.11)
RDW: 13.1 % (ref 11.5–15.5)
WBC: 12.2 10*3/uL — ABNORMAL HIGH (ref 4.0–10.5)
nRBC: 0 % (ref 0.0–0.2)

## 2018-08-21 LAB — MAGNESIUM: Magnesium: 2.4 mg/dL (ref 1.7–2.4)

## 2018-08-21 MED ORDER — METOPROLOL TARTRATE 5 MG/5ML IV SOLN
5.0000 mg | Freq: Once | INTRAVENOUS | Status: AC
  Administered 2018-08-21: 5 mg via INTRAVENOUS
  Filled 2018-08-21: qty 5

## 2018-08-21 MED ORDER — SODIUM CHLORIDE 0.9 % IV BOLUS
500.0000 mL | Freq: Once | INTRAVENOUS | Status: AC
  Administered 2018-08-21: 500 mL via INTRAVENOUS

## 2018-08-21 MED ORDER — METOPROLOL TARTRATE 25 MG PO TABS: 12.5000 mg | ORAL_TABLET | Freq: Two times a day (BID) | ORAL | 0 refills | Status: DC

## 2018-08-21 NOTE — ED Triage Notes (Signed)
Patient states SVT is acting up Patient states at work HR was 129

## 2018-08-21 NOTE — ED Provider Notes (Signed)
[facility]-EMERGENCY DEPT Provider Note   CSN: [id number] Arrival date & time: 08/21/18  1300    History   Chief Complaint Chief Complaint  Patient presents with  . Tachycardia    HPI Adrienne Marsh is a 63 y.o. female.     HPI Patient with history of SVT presents with palpitations and lightheadedness occurring suddenly while at work.  She denies shortness of breath or chest pain.  No focal weakness or numbness.  No recent cough, fever or chills.  No new lower extremity swelling or pain.  Patient states she does drink a cup of coffee daily but denies new medications or recent medication changes. Past Medical History:  Diagnosis Date  . Diverticulitis   . Essential hypertension   . Hernia cerebri (HCC)   . Supraventricular tachycardia, paroxysmal (HCC) 02/09/2014   Presented with near syncope broke with adenosine in the ER -   . Uterine prolapse     Patient Active Problem List   Diagnosis Date Noted  . PTSD (post-traumatic stress disorder) 03/26/2018  . Anxiety 03/26/2018  . Depression 03/26/2018  . Supraventricular tachycardia (HCC) 02/18/2014  . Near syncope - arrhythmogenic 02/18/2014  . Essential hypertension 02/18/2014    Past Surgical History:  Procedure Laterality Date  . ABDOMINAL HYSTERECTOMY    . ABDOMINAL SURGERY    . uterine prolapse repair       OB History   No obstetric history on file.      Home Medications    Prior to Admission medications   Medication Sig Start Date End Date Taking? Authorizing Provider  aluminum chloride (DRYSOL) 20 % external solution Apply 1 application topically at bedtime as needed (sweat).    Yes [provider]  atorvastatin (LIPITOR) 20 MG tablet Take 20 mg by mouth daily. 05/20/18  Yes [provider]  busPIRone (BUSPAR) 15 MG tablet 1 bid Patient taking differently: Take 15 mg by mouth 2 (two) times daily. 1 bid 07/15/18  Yes Shugart, Mat Carne, PA-C  hydrocortisone 1 % lotion Apply 1  application topically 2 (two) times daily as needed for itching.    Yes [provider]  ibuprofen (ADVIL,MOTRIN) 200 MG tablet Take 400 mg by mouth every 6 (six) hours as needed for moderate pain.    Yes [provider]  lisinopril (PRINIVIL,ZESTRIL) 10 MG tablet Take 10 mg by mouth daily.   Yes [provider]  LORazepam (ATIVAN) 1 MG tablet Take 1 tablet (1 mg total) by mouth every 8 (eight) hours. Patient taking differently: Take 1 mg by mouth every 8 (eight) hours as needed for anxiety.  07/15/18  Yes Shugart, Mat Carne, PA-C  OMEPRAZOLE PO Take 1 capsule by mouth daily.   Yes [provider]  oxybutynin (DITROPAN) 5 MG tablet Take 5 mg by mouth daily. 05/29/16  Yes [provider]  polyvinyl alcohol (LIQUIFILM TEARS) 1.4 % ophthalmic solution Place 1 drop into both eyes as needed for dry eyes.   Yes [provider]  sertraline (ZOLOFT) 100 MG tablet Take 2 tablets (200 mg total) by mouth daily. 07/15/18  Yes Shugart, Mat Carne, PA-C  ciprofloxacin (CIPRO) 500 MG tablet Take 1 tablet (500 mg total) by mouth every 12 (twelve) hours. Patient not taking: Reported on 08/21/2018 08/25/17   Charlynne Pander, MD  cyclobenzaprine (FLEXERIL) 10 MG tablet Take 1 tablet (10 mg total) by mouth 3 (three) times daily as needed for muscle spasms. Patient not taking: Reported on 08/21/2018 10/07/16   Street, [place],  PA-C  metoCLOPramide (REGLAN) 10 MG tablet Take 1 tablet (10 mg total) by mouth every 6 (six) hours as needed for nausea (nausea/headache). Patient not taking: Reported on 08/21/2018 08/25/17   Charlynne PanderYao, Kristofor Michalowski Hsienta, MD  metoprolol tartrate (LOPRESSOR) 25 MG tablet Take 0.5 tablets (12.5 mg total) by mouth 2 (two) times daily. 08/21/18   Loren RacerYelverton, Gibson Lad, MD  naproxen (NAPROSYN) 500 MG tablet Take 1 tablet (500 mg total) by mouth 2 (two) times daily as needed for mild pain, moderate pain or headache (TAKE WITH MEALS.). Patient not taking: Reported on 08/21/2018 10/07/16    Street, [place], PA-C    Family History Family History  Problem Relation Age of Onset  . Cancer Mother   . Cancer Father     Social History Social History   Tobacco Use  . Smoking status: Former Smoker    Packs/day: 1.00    Years: 10.00    Pack years: 10.00  . Smokeless tobacco: Never Used  Substance Use Topics  . Alcohol use: Yes    Comment: occasionally  . Drug use: No     Allergies   Codeine   Review of Systems Review of Systems  Constitutional: Negative for chills and fever.  HENT: Negative for sore throat and trouble swallowing.   Eyes: Negative for visual disturbance.  Respiratory: Negative for cough and shortness of breath.   Cardiovascular: Positive for palpitations. Negative for chest pain and leg swelling.  Gastrointestinal: Negative for abdominal pain, constipation, diarrhea, nausea and vomiting.  Genitourinary: Negative for dysuria and flank pain.  Musculoskeletal: Negative for back pain, myalgias and neck pain.  Skin: Negative for rash and wound.  Neurological: Positive for light-headedness. Negative for dizziness, weakness, numbness and headaches.  All other systems reviewed and are negative.    Physical Exam Updated Vital Signs BP 101/67 (BP Location: Left Arm)   Pulse 66   Temp 97.9 F (36.6 C) (Oral)   Resp 16   Ht 5\' 10"  (1.778 m)   Wt 86.2 kg   SpO2 96%   BMI 27.26 kg/m   Physical Exam Vitals signs and nursing note reviewed.  Constitutional:      Appearance: Normal appearance. She is well-developed.  HENT:     Head: Normocephalic and atraumatic.  Eyes:     Pupils: Pupils are equal, round, and reactive to light.  Neck:     Musculoskeletal: Normal range of motion and neck supple.  Cardiovascular:     Rate and Rhythm: Regular rhythm. Tachycardia present.     Heart sounds: No murmur. No friction rub. No gallop.   Pulmonary:     Effort: Pulmonary effort is normal. No respiratory distress.     Breath sounds: Normal breath  sounds. No stridor. No wheezing, rhonchi or rales.  Chest:     Chest wall: No tenderness.  Abdominal:     General: Bowel sounds are normal.     Palpations: Abdomen is soft.     Tenderness: There is no abdominal tenderness. There is no guarding or rebound.  Musculoskeletal: Normal range of motion.        General: No swelling, tenderness, deformity or signs of injury.     Right lower leg: No edema.     Left lower leg: No edema.  Skin:    General: Skin is warm and dry.     Capillary Refill: Capillary refill takes less than 2 seconds.     Findings: No erythema or rash.  Neurological:     Mental Status:  She is alert and oriented to person, place, and time.  Psychiatric:        Behavior: Behavior normal.      ED Treatments / Results  Labs (all labs ordered are listed, but only abnormal results are displayed) Labs Reviewed  CBC WITH DIFFERENTIAL/PLATELET - Abnormal; Notable for the following components:      Result Value   WBC 12.2 (*)    Neutro Abs 8.0 (*)    Eosinophils Absolute 0.6 (*)    All other components within normal limits  BASIC METABOLIC PANEL - Abnormal; Notable for the following components:   BUN 26 (*)    All other components within normal limits  MAGNESIUM    EKG EKG Interpretation  Date/Time:  Wednesday August 21 2018 13:13:24 EDT Ventricular Rate:  135 PR Interval:    QRS Duration: 82 QT Interval:  321 QTC Calculation: 482 R Axis:   38 Text Interpretation:  Junctional tachycardia Minimal ST depression, lateral leads Confirmed by Loren Racer ([id number]) on 08/21/2018 1:33:14 PM   Radiology No results found.  Procedures Procedures (including critical care time)  Medications Ordered in ED Medications  metoprolol tartrate (LOPRESSOR) injection 5 mg (5 mg Intravenous Given 08/21/18 1356)  sodium chloride 0.9 % bolus 500 mL (500 mLs Intravenous New Bag/Given 08/21/18 1356)     Initial Impression / Assessment and Plan / ED Course  I have reviewed the  triage vital signs and the nursing notes.  Pertinent labs & imaging results that were available during my care of the patient were reviewed by me and considered in my medical decision making (see chart for details).        Patient with similar presentation in 2018.  Similar appearance to EKG.  Attempted revert positioning with no change in heart rate.  Patient converted to normal sinus rhythm with 5 mg of Lopressor last time she was evaluated for the same.  Will check electrolytes and give Lopressor.  Patient reverted to normal sinus rhythm.  Says she is feeling much better.  Electrolytes within normal limits.  Was placed on metoprolol 12.5 mg twice daily by cardiology 2 years ago.  She is currently not taking this.  We will restart this medication and have her follow-up with cardiology as an outpatient.  Return precautions given. Final Clinical Impressions(s) / ED Diagnoses   Final diagnoses:  Tachycardia    ED Discharge Orders         Ordered    metoprolol tartrate (LOPRESSOR) 25 MG tablet  2 times daily     08/21/18 1508           Loren Racer, MD 08/21/18 1510

## 2018-09-10 ENCOUNTER — Other Ambulatory Visit: Payer: Self-pay

## 2018-09-10 ENCOUNTER — Telehealth: Payer: Self-pay | Admitting: Psychiatry

## 2018-09-10 MED ORDER — LORAZEPAM 1 MG PO TABS: ORAL_TABLET | ORAL | 2 refills | Status: AC

## 2018-09-10 NOTE — Telephone Encounter (Signed)
She wants a 3 month supply

## 2018-09-10 NOTE — Telephone Encounter (Signed)
Called into [facility]
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
## 2018-09-10 NOTE — Telephone Encounter (Signed)
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

## 2018-10-14 ENCOUNTER — Ambulatory Visit: Payer: BLUE CROSS/BLUE SHIELD | Admitting: Psychiatry

## 2020-06-15 ENCOUNTER — Emergency Department (HOSPITAL_COMMUNITY)
Admission: EM | Admit: 2020-06-15 | Discharge: 2020-06-15 | Disposition: A | Discharge status: Home / Self Care | Payer: BLUE CROSS/BLUE SHIELD | Attending: Emergency Medicine | Admitting: Emergency Medicine

## 2020-06-15 ENCOUNTER — Encounter (HOSPITAL_COMMUNITY): Payer: Self-pay | Admitting: Emergency Medicine

## 2020-06-15 ENCOUNTER — Other Ambulatory Visit: Payer: Self-pay

## 2020-06-15 DIAGNOSIS — I10 Essential (primary) hypertension: Secondary | ICD-10-CM | POA: Insufficient documentation

## 2020-06-15 DIAGNOSIS — I471 Supraventricular tachycardia: Secondary | ICD-10-CM | POA: Diagnosis not present

## 2020-06-15 DIAGNOSIS — Z79899 Other long term (current) drug therapy: Secondary | ICD-10-CM | POA: Diagnosis not present

## 2020-06-15 DIAGNOSIS — Z87891 Personal history of nicotine dependence: Secondary | ICD-10-CM | POA: Insufficient documentation

## 2020-06-15 DIAGNOSIS — R002 Palpitations: Secondary | ICD-10-CM | POA: Diagnosis present

## 2020-06-15 LAB — CBC
HCT: 35.3 % — ABNORMAL LOW (ref 36.0–46.0)
Hemoglobin: 11.6 g/dL — ABNORMAL LOW (ref 12.0–15.0)
MCH: 28.9 pg (ref 26.0–34.0)
MCHC: 32.9 g/dL (ref 30.0–36.0)
MCV: 88 fL (ref 80.0–100.0)
Platelets: 242 10*3/uL (ref 150–400)
RBC: 4.01 MIL/uL (ref 3.87–5.11)
RDW: 14.1 % (ref 11.5–15.5)
WBC: 7.2 10*3/uL (ref 4.0–10.5)
nRBC: 0 % (ref 0.0–0.2)

## 2020-06-15 LAB — BASIC METABOLIC PANEL
Anion gap: 11 (ref 5–15)
BUN: 24 mg/dL — ABNORMAL HIGH (ref 8–23)
CO2: 17 mmol/L — ABNORMAL LOW (ref 22–32)
Calcium: 8.8 mg/dL — ABNORMAL LOW (ref 8.9–10.3)
Chloride: 111 mmol/L (ref 98–111)
Creatinine, Ser: 0.78 mg/dL (ref 0.44–1.00)
GFR, Estimated: >60 mL/min (ref >60)
Glucose, Bld: 126 mg/dL — ABNORMAL HIGH (ref 70–99)
Potassium: 4 mmol/L (ref 3.5–5.1)
Sodium: 139 mmol/L (ref 135–145)

## 2020-06-15 LAB — TROPONIN I (HIGH SENSITIVITY): Troponin I (High Sensitivity): 12 ng/L (ref <18)

## 2020-06-15 MED ORDER — SODIUM CHLORIDE 0.9 % IV BOLUS: 1000.0000 mL | Freq: Once | INTRAVENOUS | Status: DC

## 2020-06-15 MED ORDER — ADENOSINE 6 MG/2ML IV SOLN
6.0000 mg | Freq: Once | INTRAVENOUS | Status: DC
  Filled 2020-06-15: qty 2

## 2020-06-15 NOTE — ED Provider Notes (Signed)
[facility]-EMERGENCY DEPT Provider Note   CSN: [id number] Arrival date & time: 06/15/20  6948     History No chief complaint on file.   Adrienne Marsh is a 65 y.o. female.  HPI Patient presents with fast heart rate.  Began acutely while at work.  Has had episodes of the same in the past.  Has not seen cardiology for it.  She is on metoprolol 12.5 mg twice a day.  States she is not good however taking it at night.  State acutely began with shortness of breath with fast heart rate.  Similar to previous episodes.  However is in a boot due to ankle sprain.    Past Medical History:  Diagnosis Date  . Diverticulitis   . Essential hypertension   . Hernia cerebri (HCC)   . Supraventricular tachycardia, paroxysmal (HCC) 02/09/2014   Presented with near syncope broke with adenosine in the ER -   . Uterine prolapse     Patient Active Problem List   Diagnosis Date Noted  . PTSD (post-traumatic stress disorder) 03/26/2018  . Anxiety 03/26/2018  . Depression 03/26/2018  . Supraventricular tachycardia (HCC) 02/18/2014  . Near syncope - arrhythmogenic 02/18/2014  . Essential hypertension 02/18/2014    Past Surgical History:  Procedure Laterality Date  . ABDOMINAL HYSTERECTOMY    . ABDOMINAL SURGERY    . uterine prolapse repair       OB History   No obstetric history on file.     Family History  Problem Relation Age of Onset  . Cancer Mother   . Cancer Father     Social History   Tobacco Use  . Smoking status: Former Smoker    Packs/day: 1.00    Years: 10.00    Pack years: 10.00  . Smokeless tobacco: Never Used  Substance Use Topics  . Alcohol use: Yes    Comment: occasionally  . Drug use: No    Home Medications Prior to Admission medications   Medication Sig Start Date End Date Taking? Authorizing Provider  aluminum chloride (DRYSOL) 20 % external solution Apply 1 application topically at bedtime as needed (sweat).    Yes [provider]  atorvastatin (LIPITOR) 20 MG tablet Take 20 mg by mouth daily. 05/20/18  Yes [provider]  lisinopril (PRINIVIL,ZESTRIL) 10 MG tablet Take 10 mg by mouth daily.   Yes [provider]  loperamide (IMODIUM) 2 MG capsule Take 2 mg by mouth as needed for diarrhea or loose stools.   Yes [provider]  LORazepam (ATIVAN) 1 MG tablet Takes 1 in the morning and 2 at hs prn anxiety Patient taking differently: Take 1-2 mg by mouth See admin instructions. Takes 1mg  in the morning and 2mg  at hs prn anxiety 09/10/18  Yes Cottle, ., MD  metoprolol tartrate (LOPRESSOR) 25 MG tablet Take 0.5 tablets (12.5 mg total) by mouth 2 (two) times daily. 08/21/18  Yes Steva Ready, MD  Multiple Vitamin (MULTIVITAMIN WITH MINERALS) TABS tablet Take 1 tablet by mouth daily.   Yes [provider]  Omega-3 Fatty Acids (FISH OIL PO) Take 1 capsule by mouth daily.   Yes [provider]  oxybutynin (DITROPAN) 5 MG tablet Take 5 mg by mouth 2 (two) times daily. 05/29/16  Yes [provider]  sertraline (ZOLOFT) 100 MG tablet Take 2 tablets (200 mg total) by mouth daily. 07/15/18  Yes Shugart, 05/31/16, PA-C  busPIRone (BUSPAR) 15 MG tablet 1 bid Patient not taking:  Reported on 06/15/2020 07/15/18   Anne Fu, PA-C    Allergies    Codeine  Review of Systems   Review of Systems  Constitutional: Negative for appetite change and fever.  HENT: Negative for congestion.   Respiratory: Positive for shortness of breath.   Cardiovascular: Positive for palpitations.  Gastrointestinal: Negative for abdominal pain.  Genitourinary: Negative for frequency.  Musculoskeletal: Negative for back pain.  Skin: Negative for rash.  Neurological: Negative for weakness.    Physical Exam Updated Vital Signs BP 113/61   Pulse 76   Temp 98 F (36.7 C) (Oral)   Resp (!) 26   SpO2 95%   Physical Exam Vitals and nursing note reviewed.  HENT:     Head: Atraumatic.  Eyes:      Extraocular Movements: Extraocular movements intact.     Pupils: Pupils are equal, round, and reactive to light.  Cardiovascular:     Rate and Rhythm: Tachycardia present.  Pulmonary:     Effort: Pulmonary effort is normal.     Breath sounds: Normal breath sounds.  Abdominal:     Tenderness: There is no abdominal tenderness.  Musculoskeletal:        General: No tenderness.     Cervical back: Neck supple.  Skin:    General: Skin is warm.     Capillary Refill: Capillary refill takes less than 2 seconds.  Neurological:     Mental Status: She is alert and oriented to person, place, and time.     ED Results / Procedures / Treatments   Labs (all labs ordered are listed, but only abnormal results are displayed) Labs Reviewed  BASIC METABOLIC PANEL - Abnormal; Notable for the following components:      Result Value   CO2 17 (*)    Glucose, Bld 126 (*)    BUN 24 (*)    Calcium 8.8 (*)    All other components within normal limits  CBC - Abnormal; Notable for the following components:   Hemoglobin 11.6 (*)    HCT 35.3 (*)    All other components within normal limits  TROPONIN I (HIGH SENSITIVITY)  TROPONIN I (HIGH SENSITIVITY)    EKG EKG Interpretation  Date/Time:  Tuesday June 15 2020 09:44:34 EST Ventricular Rate:  137 PR Interval:    QRS Duration: 84 QT Interval:  297 QTC Calculation: 449 R Axis:   26 Text Interpretation: Junctional tachycardia Borderline repol abnormality, diffuse leads 12 Lead; Mason-Likar Confirmed by Benjiman Core [phone]) on 06/15/2020 9:50:34 AM   Radiology No results found.  Procedures Procedures   Medications Ordered in ED Medications - No data to display  ED Course  I have reviewed the triage vital signs and the nursing notes.  Pertinent labs & imaging results that were available during my care of the patient were reviewed by me and considered in my medical decision making (see chart for details).    MDM  Rules/Calculators/A&P                          Patient presents with narrow complex tachycardia.  History of same.  Has been treated for the same in the ER but is not followed up with cardiology but sees her PCP who gives her medications.  States she has taken her metoprolol this morning.  Has a boot on her right foot but denies swelling in the foot.  While in the ER patient spontaneously converted back to sinus rhythm.  Adenosine had been ordered but not given.  Continue to monitor patient remained in sinus rhythm without hypoxia or recurrent tachycardia.  Even with the boot pulmonary bolus and felt less likely as a cause particular with recurrent episodes.  Discussed with patient will follow up with cardiology this time.  States that she was hoping for something she could take when she has an episode.  Discussed with patient and she will give an extra dose of her metoprolol she has an episode and hopefully can avoid a trip to the ER.  Discussed about possibility of pulmonary embolism the cause of those felt less likely.  If has symptoms of shortness of breath or chest pain unassociated with the fast heart rate will come to the ER Final Clinical Impression(s) / ED Diagnoses Final diagnoses:  SVT (supraventricular tachycardia) [facility])    Rx / DC Orders ED Discharge Orders    None       Benjiman Core, MD 06/15/20 1207

## 2020-06-15 NOTE — Discharge Instructions (Signed)
Follow-up with cardiology.  If you have another episode take either another half or another pill of your metoprolol.  If it does not go away in an hour or 2, you can come in to be seen.  Return to the ER if you develop shortness of breath, chest pain or trouble breathing as this could be a sign that there is a blood clot.

## 2020-06-15 NOTE — ED Triage Notes (Signed)
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

## 2020-06-15 NOTE — ED Notes (Signed)
Pt in bed watching tv, respirations even and unlabored. Denies any needs at this time.

## 2021-12-18 LAB — EXTERNAL GENERIC LAB PROCEDURE: COLOGUARD: POSITIVE — AB

## 2022-02-04 ENCOUNTER — Encounter (HOSPITAL_COMMUNITY): Payer: Self-pay

## 2022-02-04 ENCOUNTER — Other Ambulatory Visit: Payer: Self-pay

## 2022-02-04 ENCOUNTER — Emergency Department (HOSPITAL_COMMUNITY)
Admission: EM | Admit: 2022-02-04 | Discharge: 2022-02-04 | Disposition: A | Discharge status: Home / Self Care | Payer: BLUE CROSS/BLUE SHIELD | Attending: Emergency Medicine | Admitting: Emergency Medicine

## 2022-02-04 DIAGNOSIS — I10 Essential (primary) hypertension: Secondary | ICD-10-CM | POA: Insufficient documentation

## 2022-02-04 DIAGNOSIS — R002 Palpitations: Secondary | ICD-10-CM | POA: Insufficient documentation

## 2022-02-04 DIAGNOSIS — R000 Tachycardia, unspecified: Secondary | ICD-10-CM | POA: Insufficient documentation

## 2022-02-04 DIAGNOSIS — Z79899 Other long term (current) drug therapy: Secondary | ICD-10-CM | POA: Diagnosis not present

## 2022-02-04 LAB — BASIC METABOLIC PANEL
Anion gap: 7 (ref 5–15)
BUN: 18 mg/dL (ref 8–23)
CO2: 20 mmol/L — ABNORMAL LOW (ref 22–32)
Calcium: 8.9 mg/dL (ref 8.9–10.3)
Chloride: 115 mmol/L — ABNORMAL HIGH (ref 98–111)
Creatinine, Ser: 0.76 mg/dL (ref 0.44–1.00)
GFR, Estimated: >60 mL/min (ref >60)
Glucose, Bld: 100 mg/dL — ABNORMAL HIGH (ref 70–99)
Potassium: 3.8 mmol/L (ref 3.5–5.1)
Sodium: 142 mmol/L (ref 135–145)

## 2022-02-04 LAB — CBC
HCT: 37.4 % (ref 36.0–46.0)
Hemoglobin: 12.7 g/dL (ref 12.0–15.0)
MCH: 31.5 pg (ref 26.0–34.0)
MCHC: 34 g/dL (ref 30.0–36.0)
MCV: 92.8 fL (ref 80.0–100.0)
Platelets: 297 10*3/uL (ref 150–400)
RBC: 4.03 MIL/uL (ref 3.87–5.11)
RDW: 12.9 % (ref 11.5–15.5)
WBC: 10.5 10*3/uL (ref 4.0–10.5)
nRBC: 0 % (ref 0.0–0.2)

## 2022-02-04 LAB — TROPONIN I (HIGH SENSITIVITY): Troponin I (High Sensitivity): 5 ng/L (ref <18)

## 2022-02-04 NOTE — ED Provider Notes (Signed)
[facility] DEPT Provider Note   CSN: [id number] Arrival date & time: 02/04/22  1052     History  Chief Complaint  Patient presents with   Palpitations    Adrienne Marsh is a 66 y.o. female.   Palpitations Patient presents for palpitations.  Medical history includes SVT, HTN, anxiety, depression.  She has had very infrequent episodes of tachycardia and palpitations since 2015.  She was initially seen by cardiology but has not followed by her cardiologist.  She has been prescribed metoprolol, which she takes 12.5 mg of twice daily.  She has been instructed to take an extra 25 mg as needed for symptoms of tachycardia and palpitations.  Today, she awoke in her normal state of health.  She took her morning medications, including metoprolol.  She went to work (patient works at [employer]).  While at work, she experienced palpitations.  When this occurs, she typically feels lightheaded.  She drove herself to the ED.  While in the ED, she had resolution of symptoms.     Home Medications Prior to Admission medications   Medication Sig Start Date End Date Taking? Authorizing Provider  aluminum chloride (DRYSOL) 20 % external solution Apply 1 application  topically at bedtime as needed (to affected areas for sweating).   Yes [provider]  atorvastatin (LIPITOR) 20 MG tablet Take 20 mg by mouth daily. 05/20/18  Yes [provider]  BIOTIN PO Take 1 tablet by mouth daily.   Yes [provider]  docusate sodium (COLACE) 100 MG capsule Take 100 mg by mouth daily as needed for mild constipation.   Yes [provider]  lisinopril (PRINIVIL,ZESTRIL) 10 MG tablet Take 10 mg by mouth daily.   Yes [provider]  LORazepam (ATIVAN) 1 MG tablet Takes 1 in the morning and 2 at hs prn anxiety Patient taking differently: Take 1-2 mg by mouth See admin instructions. Take 1.5-2 mg by mouth at bedtime and an additional 1 mg once a day as  needed for anxiety 09/10/18  Yes Cottle, [employer]ley [employer] MD  metoprolol tartrate (LOPRESSOR) 25 MG tablet Take 0.5 tablets (12.5 mg total) by mouth 2 (two) times daily. Patient taking differently: Take 12.5-25 mg by mouth in the morning. Take 12.5 mg by mouth in the morning and an additional 25 mg once a day as directed/as needed for SVT symptoms 08/21/18  Yes Julianne Rice, MD  Multiple Vitamin (MULTIVITAMIN WITH MINERALS) TABS tablet Take 1 tablet by mouth daily.   Yes [provider]  oxybutynin (DITROPAN) 5 MG tablet Take 5 mg by mouth 2 (two) times daily. 05/29/16  Yes [provider]  sertraline (ZOLOFT) 100 MG tablet Take 2 tablets (200 mg total) by mouth daily. 07/15/18  Yes Shugart, Lissa Hoard, PA-C  busPIRone (BUSPAR) 15 MG tablet 1 bid Patient not taking: Reported on 02/04/2022 07/15/18   Comer Locket, PA-C      Allergies    Codeine    Review of Systems   Review of Systems  Cardiovascular:  Positive for palpitations.  Neurological:  Positive for light-headedness.  All other systems reviewed and are negative.   Physical Exam Updated Vital Signs BP 128/71   Pulse 60   Temp 97.8 F (36.6 C) (Oral)   Resp 15   Ht 5\' 11"  (1.803 m)   Wt 81.6 kg   SpO2 96%   BMI 25.10 kg/m  Physical Exam Vitals and nursing note reviewed.  Constitutional:      General:  She is not in acute distress.    Appearance: Normal appearance. She is well-developed. She is not ill-appearing, toxic-appearing or diaphoretic.  HENT:     Head: Normocephalic and atraumatic.     Right Ear: External ear normal.     Left Ear: External ear normal.     Nose: Nose normal.     Mouth/Throat:     Mouth: Mucous membranes are moist.     Pharynx: Oropharynx is clear.  Eyes:     Extraocular Movements: Extraocular movements intact.     Conjunctiva/sclera: Conjunctivae normal.  Cardiovascular:     Rate and Rhythm: Normal rate and regular rhythm.     Heart sounds: No murmur heard. Pulmonary:     Effort:  Pulmonary effort is normal. No respiratory distress.     Breath sounds: Normal breath sounds. No wheezing or rales.  Abdominal:     Palpations: Abdomen is soft.     Tenderness: There is no abdominal tenderness.  Musculoskeletal:        General: No swelling.     Cervical back: Normal range of motion and neck supple.  Skin:    General: Skin is warm and dry.     Capillary Refill: Capillary refill takes less than 2 seconds.  Neurological:     Mental Status: She is alert.  Psychiatric:        Mood and Affect: Mood normal.     ED Results / Procedures / Treatments   Labs (all labs ordered are listed, but only abnormal results are displayed) Labs Reviewed  BASIC METABOLIC PANEL - Abnormal; Notable for the following components:      Result Value   Chloride 115 (*)    CO2 20 (*)    Glucose, Bld 100 (*)    All other components within normal limits  CBC  MAGNESIUM  TROPONIN I (HIGH SENSITIVITY)    EKG EKG Interpretation  Date/Time:  Saturday February 04 2022 11:57:17 EDT Ventricular Rate:  68 PR Interval:  207 QRS Duration: 88 QT Interval:  415 QTC Calculation: 442 R Axis:   33 Text Interpretation: Sinus rhythm Abnormal R-wave progression, early transition Confirmed by Godfrey Pick ([id number]) on 02/04/2022 2:11:47 PM  Radiology No results found.  Procedures Procedures    Medications Ordered in ED Medications - No data to display  ED Course/ Medical Decision Making/ A&P                           Medical Decision Making Amount and/or Complexity of Data Reviewed Labs: ordered.   Patient presents for palpitations.  Onset was approximately 1 hour prior to arrival in the ED.  She has a history of SVT and did take a extra dose of metoprolol when the palpitations began, as instructed to by her primary care doctor.  On arrival in the ED, heart rate was 131.  EKG showed a narrow complex regular rhythm consistent with possible atrial flutter with 2 1 conduction versus slow SVT.   Upon being bedded in the ED, patient had spontaneous resolution of her tachycardia.  Subsequent heart rate was in the 60s and 70s.  Repeat EKG showed normal sinus rhythm.  Patient had resolutions of palpitations and did not experience any lightheadedness, including with standing and walking.  Labs were obtained to assess for electrolyte levels.  Laboratory results were unremarkable.  Following patient's conversion to sinus rhythm, she was anxious to go.  She was advised to follow-up with cardiology  and return for any further episodes.  She was discharged in good condition.        Final Clinical Impression(s) / ED Diagnoses Final diagnoses:  Palpitations    Rx / DC Orders ED Discharge Orders     None         Godfrey Pick, MD 02/04/22 [phone]

## 2022-02-04 NOTE — ED Notes (Signed)
Pt reports removing own IV because she thought she was getting discharged.

## 2022-02-04 NOTE — ED Triage Notes (Signed)
Patient c/o palpitations and states she has a history of SVT. Patient also reports SOB.

## 2022-02-04 NOTE — Discharge Instructions (Signed)
Continue taking your home medications as prescribed.  There is a telephone number below if you do wish to establish care with a cardiologist.  Return to the emergency department for any further episodes of concern.

## 2023-06-01 ENCOUNTER — Emergency Department (HOSPITAL_COMMUNITY): Payer: Medicare Other

## 2023-06-01 ENCOUNTER — Inpatient Hospital Stay (HOSPITAL_COMMUNITY)
Admission: EM | Admit: 2023-06-01 | Discharge: 2023-06-08 | DRG: 391 | Disposition: A | Discharge status: Home / Self Care | Payer: Medicare Other | Attending: Internal Medicine | Admitting: Internal Medicine

## 2023-06-01 ENCOUNTER — Encounter (HOSPITAL_COMMUNITY): Payer: Self-pay | Admitting: Emergency Medicine

## 2023-06-01 DIAGNOSIS — R112 Nausea with vomiting, unspecified: Secondary | ICD-10-CM | POA: Diagnosis not present

## 2023-06-01 DIAGNOSIS — K2970 Gastritis, unspecified, without bleeding: Secondary | ICD-10-CM | POA: Diagnosis present

## 2023-06-01 DIAGNOSIS — R109 Unspecified abdominal pain: Secondary | ICD-10-CM | POA: Diagnosis not present

## 2023-06-01 DIAGNOSIS — E669 Obesity, unspecified: Secondary | ICD-10-CM | POA: Diagnosis present

## 2023-06-01 DIAGNOSIS — F419 Anxiety disorder, unspecified: Secondary | ICD-10-CM | POA: Diagnosis present

## 2023-06-01 DIAGNOSIS — I4719 Other supraventricular tachycardia: Secondary | ICD-10-CM | POA: Diagnosis present

## 2023-06-01 DIAGNOSIS — A09 Infectious gastroenteritis and colitis, unspecified: Secondary | ICD-10-CM | POA: Diagnosis not present

## 2023-06-01 DIAGNOSIS — E860 Dehydration: Secondary | ICD-10-CM | POA: Diagnosis present

## 2023-06-01 DIAGNOSIS — R822 Biliuria: Secondary | ICD-10-CM | POA: Diagnosis present

## 2023-06-01 DIAGNOSIS — R809 Proteinuria, unspecified: Secondary | ICD-10-CM | POA: Diagnosis present

## 2023-06-01 DIAGNOSIS — F32A Depression, unspecified: Secondary | ICD-10-CM | POA: Diagnosis present

## 2023-06-01 DIAGNOSIS — K828 Other specified diseases of gallbladder: Secondary | ICD-10-CM | POA: Diagnosis present

## 2023-06-01 DIAGNOSIS — K529 Noninfective gastroenteritis and colitis, unspecified: Secondary | ICD-10-CM | POA: Diagnosis present

## 2023-06-01 DIAGNOSIS — N3941 Urge incontinence: Secondary | ICD-10-CM | POA: Diagnosis present

## 2023-06-01 DIAGNOSIS — Z885 Allergy status to narcotic agent status: Secondary | ICD-10-CM

## 2023-06-01 DIAGNOSIS — Z8719 Personal history of other diseases of the digestive system: Secondary | ICD-10-CM

## 2023-06-01 DIAGNOSIS — E785 Hyperlipidemia, unspecified: Secondary | ICD-10-CM | POA: Diagnosis present

## 2023-06-01 DIAGNOSIS — Z90711 Acquired absence of uterus with remaining cervical stump: Secondary | ICD-10-CM

## 2023-06-01 DIAGNOSIS — Z87891 Personal history of nicotine dependence: Secondary | ICD-10-CM

## 2023-06-01 DIAGNOSIS — K449 Diaphragmatic hernia without obstruction or gangrene: Secondary | ICD-10-CM | POA: Diagnosis present

## 2023-06-01 DIAGNOSIS — K219 Gastro-esophageal reflux disease without esophagitis: Secondary | ICD-10-CM | POA: Diagnosis present

## 2023-06-01 DIAGNOSIS — I10 Essential (primary) hypertension: Secondary | ICD-10-CM | POA: Diagnosis present

## 2023-06-01 DIAGNOSIS — N179 Acute kidney failure, unspecified: Secondary | ICD-10-CM | POA: Diagnosis present

## 2023-06-01 DIAGNOSIS — R197 Diarrhea, unspecified: Secondary | ICD-10-CM | POA: Diagnosis not present

## 2023-06-01 DIAGNOSIS — K631 Perforation of intestine (nontraumatic): Secondary | ICD-10-CM | POA: Diagnosis present

## 2023-06-01 DIAGNOSIS — F4310 Post-traumatic stress disorder, unspecified: Secondary | ICD-10-CM | POA: Diagnosis present

## 2023-06-01 DIAGNOSIS — Z79899 Other long term (current) drug therapy: Secondary | ICD-10-CM

## 2023-06-01 DIAGNOSIS — Z9049 Acquired absence of other specified parts of digestive tract: Secondary | ICD-10-CM

## 2023-06-01 LAB — COMPREHENSIVE METABOLIC PANEL
ALT: 45 U/L — ABNORMAL HIGH (ref 0–44)
AST: 68 U/L — ABNORMAL HIGH (ref 15–41)
Albumin: 3.4 g/dL — ABNORMAL LOW (ref 3.5–5.0)
Alkaline Phosphatase: 139 U/L — ABNORMAL HIGH (ref 38–126)
Anion gap: 10 (ref 5–15)
BUN: 37 mg/dL — ABNORMAL HIGH (ref 8–23)
CO2: 19 mmol/L — ABNORMAL LOW (ref 22–32)
Calcium: 8.6 mg/dL — ABNORMAL LOW (ref 8.9–10.3)
Chloride: 111 mmol/L (ref 98–111)
Creatinine, Ser: 1.28 mg/dL — ABNORMAL HIGH (ref 0.44–1.00)
GFR, Estimated: 46 mL/min — ABNORMAL LOW (ref >60)
Glucose, Bld: 137 mg/dL — ABNORMAL HIGH (ref 70–99)
Potassium: 3.8 mmol/L (ref 3.5–5.1)
Sodium: 140 mmol/L (ref 135–145)
Total Bilirubin: 1.3 mg/dL — ABNORMAL HIGH (ref 0.0–1.2)
Total Protein: 6.9 g/dL (ref 6.5–8.1)

## 2023-06-01 LAB — URINALYSIS, ROUTINE W REFLEX MICROSCOPIC
Glucose, UA: NEGATIVE mg/dL
Hgb urine dipstick: NEGATIVE
Ketones, ur: NEGATIVE mg/dL
Nitrite: NEGATIVE
Protein, ur: 100 mg/dL — AB
Specific Gravity, Urine: 1.03 (ref 1.005–1.030)
pH: 5 (ref 5.0–8.0)

## 2023-06-01 LAB — CBC
HCT: 34 % — ABNORMAL LOW (ref 36.0–46.0)
Hemoglobin: 10.6 g/dL — ABNORMAL LOW (ref 12.0–15.0)
MCH: 29.4 pg (ref 26.0–34.0)
MCHC: 31.2 g/dL (ref 30.0–36.0)
MCV: 94.2 fL (ref 80.0–100.0)
Platelets: 299 10*3/uL (ref 150–400)
RBC: 3.61 MIL/uL — ABNORMAL LOW (ref 3.87–5.11)
RDW: 13.5 % (ref 11.5–15.5)
WBC: 12.9 10*3/uL — ABNORMAL HIGH (ref 4.0–10.5)
nRBC: 0 % (ref 0.0–0.2)

## 2023-06-01 LAB — LIPASE, BLOOD: Lipase: 27 U/L (ref 11–51)

## 2023-06-01 LAB — LACTIC ACID, PLASMA: Lactic Acid, Venous: 0.7 mmol/L (ref 0.5–1.9)

## 2023-06-01 MED ORDER — ENOXAPARIN SODIUM 40 MG/0.4ML IJ SOSY
40.0000 mg | PREFILLED_SYRINGE | INTRAMUSCULAR | Status: DC
  Administered 2023-06-01 – 2023-06-07 (×7): 40 mg via SUBCUTANEOUS
  Filled 2023-06-01 (×7): qty 0.4

## 2023-06-01 MED ORDER — FAMOTIDINE 20 MG PO TABS
20.0000 mg | ORAL_TABLET | Freq: Two times a day (BID) | ORAL | Status: DC | PRN
  Administered 2023-06-04: 20 mg via ORAL
  Filled 2023-06-01: qty 1

## 2023-06-01 MED ORDER — MIRABEGRON ER 25 MG PO TB24
25.0000 mg | ORAL_TABLET | Freq: Every day | ORAL | Status: DC
Start: 2023-06-01 — End: 2023-06-08
  Administered 2023-06-02 – 2023-06-07 (×6): 25 mg via ORAL
  Filled 2023-06-01 (×7): qty 1

## 2023-06-01 MED ORDER — ATORVASTATIN CALCIUM 20 MG PO TABS
20.0000 mg | ORAL_TABLET | Freq: Every day | ORAL | Status: DC
  Administered 2023-06-02 – 2023-06-08 (×7): 20 mg via ORAL
  Filled 2023-06-01 (×7): qty 1

## 2023-06-01 MED ORDER — ACETAMINOPHEN 650 MG RE SUPP: 650.0000 mg | Freq: Four times a day (QID) | RECTAL | Status: DC | PRN

## 2023-06-01 MED ORDER — PIPERACILLIN-TAZOBACTAM 3.375 G IVPB 30 MIN
3.3750 g | Freq: Once | INTRAVENOUS | Status: AC
  Administered 2023-06-01: 3.375 g via INTRAVENOUS
  Filled 2023-06-01: qty 50

## 2023-06-01 MED ORDER — ONDANSETRON HCL 4 MG/2ML IJ SOLN
4.0000 mg | Freq: Once | INTRAMUSCULAR | Status: AC | PRN
Start: 2023-06-01 — End: 2023-06-01
  Administered 2023-06-01: 4 mg via INTRAVENOUS
  Filled 2023-06-01: qty 2

## 2023-06-01 MED ORDER — METOPROLOL TARTRATE 12.5 MG HALF TABLET
12.5000 mg | ORAL_TABLET | Freq: Every day | ORAL | Status: DC
Start: 2023-06-02 — End: 2023-06-06
  Administered 2023-06-02: 25 mg via ORAL
  Administered 2023-06-02 – 2023-06-06 (×5): 12.5 mg via ORAL
  Filled 2023-06-01 (×3): qty 1
  Filled 2023-06-01: qty 2
  Filled 2023-06-01 (×2): qty 1

## 2023-06-01 MED ORDER — IOHEXOL 300 MG/ML  SOLN
80.0000 mL | Freq: Once | INTRAMUSCULAR | Status: AC | PRN
  Administered 2023-06-01: 80 mL via INTRAVENOUS

## 2023-06-01 MED ORDER — SERTRALINE HCL 100 MG PO TABS
200.0000 mg | ORAL_TABLET | Freq: Every day | ORAL | Status: DC
  Administered 2023-06-02 – 2023-06-08 (×7): 200 mg via ORAL
  Filled 2023-06-01 (×7): qty 2

## 2023-06-01 MED ORDER — ACETAMINOPHEN 325 MG PO TABS
650.0000 mg | ORAL_TABLET | Freq: Four times a day (QID) | ORAL | Status: DC | PRN
Start: 2023-06-01 — End: 2023-06-08
  Administered 2023-06-02 – 2023-06-08 (×4): 650 mg via ORAL
  Filled 2023-06-01 (×5): qty 2

## 2023-06-01 MED ORDER — LISINOPRIL 10 MG PO TABS: 10.0000 mg | ORAL_TABLET | Freq: Every day | ORAL | Status: DC

## 2023-06-01 MED ORDER — ONDANSETRON HCL 4 MG/2ML IJ SOLN
4.0000 mg | Freq: Four times a day (QID) | INTRAMUSCULAR | Status: DC | PRN
  Administered 2023-06-01: 4 mg via INTRAVENOUS
  Filled 2023-06-01: qty 2

## 2023-06-01 MED ORDER — ONDANSETRON HCL 4 MG PO TABS: 4.0000 mg | ORAL_TABLET | Freq: Four times a day (QID) | ORAL | Status: DC | PRN

## 2023-06-01 MED ORDER — PRAZOSIN HCL 1 MG PO CAPS
1.0000 mg | ORAL_CAPSULE | Freq: Every day | ORAL | Status: DC
  Administered 2023-06-02 – 2023-06-07 (×6): 1 mg via ORAL
  Filled 2023-06-01 (×7): qty 1

## 2023-06-01 MED ORDER — PANTOPRAZOLE SODIUM 40 MG PO TBEC
40.0000 mg | DELAYED_RELEASE_TABLET | Freq: Every day | ORAL | Status: DC
  Administered 2023-06-01 – 2023-06-07 (×7): 40 mg via ORAL
  Filled 2023-06-01 (×7): qty 1

## 2023-06-01 MED ORDER — HYDROMORPHONE HCL 1 MG/ML IJ SOLN
0.5000 mg | Freq: Four times a day (QID) | INTRAMUSCULAR | Status: DC | PRN
  Administered 2023-06-01 – 2023-06-05 (×14): 0.5 mg via INTRAVENOUS
  Filled 2023-06-01 (×14): qty 0.5

## 2023-06-01 MED ORDER — LACTATED RINGERS IV BOLUS
1000.0000 mL | Freq: Once | INTRAVENOUS | Status: AC
  Administered 2023-06-01: 1000 mL via INTRAVENOUS

## 2023-06-01 MED ORDER — LORAZEPAM 1 MG PO TABS
1.0000 mg | ORAL_TABLET | Freq: Two times a day (BID) | ORAL | Status: DC | PRN
  Administered 2023-06-02 – 2023-06-03 (×2): 1 mg via ORAL
  Administered 2023-06-04: 2 mg via ORAL
  Administered 2023-06-04 – 2023-06-07 (×6): 1 mg via ORAL
  Filled 2023-06-01 (×4): qty 1
  Filled 2023-06-01: qty 2
  Filled 2023-06-01 (×3): qty 1
  Filled 2023-06-01: qty 2
  Filled 2023-06-01: qty 1

## 2023-06-01 MED ORDER — SENNOSIDES-DOCUSATE SODIUM 8.6-50 MG PO TABS
1.0000 | ORAL_TABLET | Freq: Every evening | ORAL | Status: DC | PRN
  Administered 2023-06-05 – 2023-06-07 (×3): 1 via ORAL
  Filled 2023-06-01 (×3): qty 1

## 2023-06-01 MED ORDER — ADULT MULTIVITAMIN W/MINERALS CH
1.0000 | ORAL_TABLET | Freq: Every day | ORAL | Status: DC
  Administered 2023-06-02 – 2023-06-08 (×7): 1 via ORAL
  Filled 2023-06-01 (×7): qty 1

## 2023-06-01 NOTE — ED Provider Notes (Signed)
[place] EMERGENCY DEPARTMENT AT [facility] Provider Note   CSN: [id number] Arrival date & time: 06/01/23  1411     History  Chief Complaint  Patient presents with   Abdominal Pain   Emesis    Adrienne Marsh is a 68 y.o. female.  68 year old female with a history of diverticulitis status post partial hysterectomy, hernia status post repair, and partial hysterectomy who presents to the emergency department with abdominal pain and nausea and vomiting.  On Saturday had a tooth removed.  Has been on amoxicillin 875 mg twice daily since.  Thinks that it has been causing her symptoms.  Since then has been having left-sided abdominal pain that is 6/10 in severity and constant.  No exacerbating or alleviating factors.  Sharp.  Has been trying NSAIDs for the pain without relief.  Also has been having 2-3 episodes of nonbloody nonbilious emesis per day.  Has been alternating between constipation and diarrhea since then.  Denies fevers.       Home Medications Prior to Admission medications   Medication Sig Start Date End Date Taking? Authorizing Provider  aluminum chloride (DRYSOL) 20 % external solution Apply 1 application  topically at bedtime as needed (to affected areas for sweating).    [provider]  atorvastatin (LIPITOR) 20 MG tablet Take 20 mg by mouth daily. 05/20/18   [provider]  BIOTIN PO Take 1 tablet by mouth daily.    [provider]  busPIRone (BUSPAR) 15 MG tablet 1 bid Patient not taking: Reported on 02/04/2022 07/15/18   Shugart, Mat Carne, PA-C  docusate sodium (COLACE) 100 MG capsule Take 100 mg by mouth daily as needed for mild constipation.    [provider]  lisinopril (PRINIVIL,ZESTRIL) 10 MG tablet Take 10 mg by mouth daily.    [provider]  LORazepam (ATIVAN) 1 MG tablet Takes 1 in the morning and 2 at hs prn anxiety Patient taking differently: Take 1-2 mg by mouth See admin instructions. Take 1.5-2 mg by  mouth at bedtime and an additional 1 mg once a day as needed for anxiety 09/10/18   Cottle, Steva Ready., MD  metoprolol tartrate (LOPRESSOR) 25 MG tablet Take 0.5 tablets (12.5 mg total) by mouth 2 (two) times daily. Patient taking differently: Take 12.5-25 mg by mouth in the morning. Take 12.5 mg by mouth in the morning and an additional 25 mg once a day as directed/as needed for SVT symptoms 08/21/18   Loren Racer, MD  Multiple Vitamin (MULTIVITAMIN WITH MINERALS) TABS tablet Take 1 tablet by mouth daily.    [provider]  oxybutynin (DITROPAN) 5 MG tablet Take 5 mg by mouth 2 (two) times daily. 05/29/16   [provider]  sertraline (ZOLOFT) 100 MG tablet Take 2 tablets (200 mg total) by mouth daily. 07/15/18   Shugart, Mat Carne, PA-C      Allergies    Codeine    Review of Systems   Review of Systems  Physical Exam Updated Vital Signs BP (!) 149/63 (BP Location: Right Arm)   Pulse 76   Temp 98.3 F (36.8 C) (Oral)   Resp 20   SpO2 95%  Physical Exam Vitals and nursing note reviewed.  Constitutional:      General: She is not in acute distress.    Appearance: She is well-developed.  HENT:     Head: Normocephalic and atraumatic.     Right Ear: External ear normal.     Left Ear: External ear  normal.     Nose: Nose normal.  Eyes:     Extraocular Movements: Extraocular movements intact.     Conjunctiva/sclera: Conjunctivae normal.     Pupils: Pupils are equal, round, and reactive to light.  Cardiovascular:     Rate and Rhythm: Normal rate and regular rhythm.  Pulmonary:     Effort: Pulmonary effort is normal. No respiratory distress.  Abdominal:     General: Abdomen is flat. There is no distension.     Palpations: Abdomen is soft. There is no mass.     Tenderness: There is abdominal tenderness (Left mid abdomen and left lower quadrant). There is no guarding.  Musculoskeletal:     Cervical back: Normal range of motion and neck supple.     Right lower leg: No  edema.     Left lower leg: No edema.  Skin:    General: Skin is warm and dry.  Neurological:     Mental Status: She is alert and oriented to person, place, and time. Mental status is at baseline.  Psychiatric:        Mood and Affect: Mood normal.     ED Results / Procedures / Treatments   Labs (all labs ordered are listed, but only abnormal results are displayed) Labs Reviewed  COMPREHENSIVE METABOLIC PANEL - Abnormal; Notable for the following components:      Result Value   CO2 19 (*)    Glucose, Bld 137 (*)    BUN 37 (*)    Creatinine, Ser 1.28 (*)    Calcium 8.6 (*)    Albumin 3.4 (*)    AST 68 (*)    ALT 45 (*)    Alkaline Phosphatase 139 (*)    Total Bilirubin 1.3 (*)    GFR, Estimated 46 (*)    All other components within normal limits  CBC - Abnormal; Notable for the following components:   WBC 12.9 (*)    RBC 3.61 (*)    Hemoglobin 10.6 (*)    HCT 34.0 (*)    All other components within normal limits  URINALYSIS, ROUTINE W REFLEX MICROSCOPIC - Abnormal; Notable for the following components:   Color, Urine AMBER (*)    APPearance CLOUDY (*)    Bilirubin Urine SMALL (*)    Protein, ur 100 (*)    Leukocytes,Ua TRACE (*)    Bacteria, UA RARE (*)    Non Squamous Epithelial 0-5 (*)    All other components within normal limits  CULTURE, BLOOD (ROUTINE X 2)  CULTURE, BLOOD (ROUTINE X 2)  LIPASE, BLOOD  LACTIC ACID, PLASMA  LACTIC ACID, PLASMA  HIV ANTIBODY (ROUTINE TESTING W REFLEX)    EKG EKG Interpretation Date/Time:  Friday June 01 2023 14:49:10 EST Ventricular Rate:  79 PR Interval:  160 QRS Duration:  83 QT Interval:  372 QTC Calculation: 427 R Axis:   24  Text Interpretation: Sinus rhythm No significant change since prior 9/23 Confirmed by Meridee Score [id number]) on 06/01/2023 3:15:09 PM  Radiology CT ABDOMEN PELVIS W CONTRAST Result Date: 06/01/2023 CLINICAL DATA:  Left lower quadrant pain, nausea, and vomiting for four days. EXAM: CT  ABDOMEN AND PELVIS WITH CONTRAST TECHNIQUE: Multidetector CT imaging of the abdomen and pelvis was performed using the standard protocol following bolus administration of intravenous contrast. RADIATION DOSE REDUCTION: This exam was performed according to the departmental dose-optimization program which includes automated exposure control, adjustment of the mA and/or kV according to patient size and/or use of iterative  reconstruction technique. CONTRAST:  80mL OMNIPAQUE IOHEXOL 300 MG/ML  SOLN COMPARISON:  None Available. FINDINGS: Lower Chest: No acute findings. Hepatobiliary: No suspicious hepatic masses identified. Gallbladder is unremarkable. No evidence of biliary ductal dilatation. Pancreas:  No mass or inflammatory changes. Spleen: Within normal limits in size. Two sub-centimeter low-attenuation lesions are seen in the dome of the spleen which are too small to characterize, but likely represent benign hemangiomas Adrenals/Urinary Tract: No suspicious masses identified. No evidence of ureteral calculi or hydronephrosis. Stomach/Bowel: Moderate hiatal hernia. No evidence of bowel obstruction. Mild wall thickening and pneumatosis is seen involving small bowel in the left lower quadrant, with extraluminal gas tracking into the adjacent left abdominal mesentery. This is consistent with enteritis no No evidence of free intraperitoneal air or abscess. Anterior abdominal wall surgical mesh seen, without recurrent hernia. Vascular/Lymphatic: No pathologically enlarged lymph nodes. No acute vascular findings. Reproductive:  No mass or other significant abnormality. Other:  None. Musculoskeletal:  No suspicious bone lesions identified. IMPRESSION: Small bowel wall thickening in the left lower quadrant, with pneumatosis and extraluminal gas in the adjacent mesentery, likely due to acute enteritis with micro perforation. No evidence of abscess or bowel obstruction. Moderate hiatal hernia. Electronically Signed   By: Danae Orleans M.D.   On: 06/01/2023 17:32    Procedures Procedures    Medications Ordered in ED Medications  piperacillin-tazobactam (ZOSYN) IVPB 3.375 g (3.375 g Intravenous New Bag/Given 06/01/23 1935)  enoxaparin (LOVENOX) injection 40 mg (has no administration in time range)  acetaminophen (TYLENOL) tablet 650 mg (has no administration in time range)    Or  acetaminophen (TYLENOL) suppository 650 mg (has no administration in time range)  senna-docusate (Senokot-S) tablet 1 tablet (has no administration in time range)  ondansetron (ZOFRAN) tablet 4 mg (has no administration in time range)    Or  ondansetron (ZOFRAN) injection 4 mg (has no administration in time range)  ondansetron (ZOFRAN) injection 4 mg (4 mg Intravenous Given 06/01/23 1649)  lactated ringers bolus 1,000 mL (1,000 mLs Intravenous New Bag/Given 06/01/23 1650)  iohexol (OMNIPAQUE) 300 MG/ML solution 80 mL (80 mLs Intravenous Contrast Given 06/01/23 1702)    ED Course/ Medical Decision Making/ A&P Clinical Course as of 06/01/23 1956  Fri Jun 01, 2023  1823 Dr Maisie Fus from general surgery consulted.  Recommends admission to medicine and they will see the patient in the morning. [RP]  1956 Dr Kirke Corin from hospitalist to admit.  [RP]    Clinical Course User Index [RP] Rondel Baton, MD                                 Medical Decision Making Amount and/or Complexity of Data Reviewed Labs: ordered. Radiology: ordered.  Risk Prescription drug management. Decision regarding hospitalization.   Adrienne Marsh is a 68 y.o. female with comorbidities that complicate the patient evaluation including diverticulitis status post partial hysterectomy, hernia status post repair, and partial hysterectomy who presents to the emergency department with abdominal pain and nausea and vomiting.    Initial Ddx:  Diverticulitis, kidney stone, small bowel obstruction, gastroenteritis, medication side effect  MDM/Course:  Patient  resents emergency department with nausea vomiting and diarrhea as well as abdominal pain.  This is in the setting of a dental infection for which she is taking amoxicillin.  On exam does have some left-sided abdominal tenderness palpation.  CT scan shows enteritis with microperforations.  Discussed with surgery who  recommends admission and they will evaluate her in the morning.  Patient started on Zosyn.  Upon re-evaluation pain and nausea under control.  Admitted to hospitalist.  This patient presents to the ED for concern of complaints listed in HPI, this involves an extensive number of treatment options, and is a complaint that carries with it a high risk of complications and morbidity. Disposition including potential need for admission considered.   Dispo: Admit to Floor  Records reviewed Outpatient Clinic Notes The following labs were independently interpreted: Chemistry and show AKI and elevated LFTs consistent with prior biliary disease I independently reviewed the following imaging with scope of interpretation limited to determining acute life threatening conditions related to emergency care: CT Abdomen/Pelvis and agree with the radiologist interpretation with the following exceptions: none I personally reviewed and interpreted cardiac monitoring: normal sinus rhythm  I personally reviewed and interpreted the pt's EKG: see above for interpretation  I have reviewed the patients home medications and made adjustments as needed Consults: General Surgery and Hospitalist Social Determinants of health:  Elderly  Portions of this note were generated with Scientist, clinical (histocompatibility and immunogenetics). Dictation errors may occur despite best attempts at proofreading.     Final Clinical Impression(s) / ED Diagnoses Final diagnoses:  Nausea vomiting and diarrhea  Enteritis    Rx / DC Orders ED Discharge Orders     None         Rondel Baton, MD 06/01/23 1956

## 2023-06-01 NOTE — ED Notes (Signed)
Attmped to call floor x3/ no answer/ charge nurse [place] made aware

## 2023-06-01 NOTE — ED Notes (Signed)
ED TO INPATIENT HANDOFF REPORT  Name/Age/Gender Adrienne Marsh 68 y.o. female  Code Status    Code Status Orders  (From admission, onward)           Start     Ordered   06/01/23 1920  Full code  Continuous       Question:  By:  Answer:  Consent: discussion documented in EHR   06/01/23 1919           Code Status History     This patient has a current code status but no historical code status.       Home/SNF/Other Home  Chief Complaint Enteritis [K52.9]  Level of Care/Admitting Diagnosis ED Disposition     ED Disposition  Admit   Condition  --   Comment  Hospital Area: [facility] [[id number]]  Level of Care: Med-Surg [16]  May place patient in observation at [facility] or [facility] if equivalent level of care is available:: No  Covid Evaluation: Asymptomatic - no recent exposure (last 10 days) testing not required  Diagnosis: Enteritis [098119]  Admitting Physician: Steffanie Rainwater [[id number]]  Attending Physician: Steffanie Rainwater [[id number]]          Medical History Past Medical History:  Diagnosis Date   Diverticulitis    Essential hypertension    Hernia cerebri (HCC)    Supraventricular tachycardia, paroxysmal (HCC) 02/09/2014   Presented with near syncope broke with adenosine in the ER -    Uterine prolapse     Allergies Allergies  Allergen Reactions   Codeine Other (See Comments)    "Makes me feel funny"    IV Location/Drains/Wounds Patient Lines/Drains/Airways Status     Active Line/Drains/Airways     Name Placement date Placement time Site Days   Peripheral IV 06/01/23 20 G 1" Left Antecubital 06/01/23  1649  Antecubital  less than 1            Labs/Imaging Results for orders placed or performed during the hospital encounter of 06/01/23 (from the past 48 hours)  Lipase, blood     Status: None   Collection Time: 06/01/23  3:01 PM  Result Value Ref Range   Lipase 27 11 - 51 U/L    Comment:  Performed at [facility], [address], [place], [place] [address]  Comprehensive metabolic panel     Status: Abnormal   Collection Time: 06/01/23  3:01 PM  Result Value Ref Range   Sodium 140 135 - 145 mmol/L   Potassium 3.8 3.5 - 5.1 mmol/L   Chloride 111 98 - 111 mmol/L   CO2 19 (L) 22 - 32 mmol/L   Glucose, Bld 137 (H) 70 - 99 mg/dL    Comment: Glucose reference range applies only to samples taken after fasting for at least 8 hours.   BUN 37 (H) 8 - 23 mg/dL   Creatinine, Ser 9.62 (H) 0.44 - 1.00 mg/dL   Calcium 8.6 (L) 8.9 - 10.3 mg/dL   Total Protein 6.9 6.5 - 8.1 g/dL   Albumin 3.4 (L) 3.5 - 5.0 g/dL   AST 68 (H) 15 - 41 U/L   ALT 45 (H) 0 - 44 U/L   Alkaline Phosphatase 139 (H) 38 - 126 U/L   Total Bilirubin 1.3 (H) 0.0 - 1.2 mg/dL   GFR, Estimated 46 (L) >60 mL/min    Comment: (NOTE) Calculated using the CKD-EPI Creatinine Equation (2021)    Anion gap 10 5 - 15  Comment: Performed at [facility], [address], [place], [place] [address]  CBC     Status: Abnormal   Collection Time: 06/01/23  3:01 PM  Result Value Ref Range   WBC 12.9 (H) 4.0 - 10.5 K/uL   RBC 3.61 (L) 3.87 - 5.11 MIL/uL   Hemoglobin 10.6 (L) 12.0 - 15.0 g/dL   HCT 91.4 (L) 78.2 - 95.6 %   MCV 94.2 80.0 - 100.0 fL   MCH 29.4 26.0 - 34.0 pg   MCHC 31.2 30.0 - 36.0 g/dL   RDW 21.3 08.6 - 57.8 %   Platelets 299 150 - 400 K/uL   nRBC 0.0 0.0 - 0.2 %    Comment: Performed at [facility], [address], [place], [place] [address]  Urinalysis, Routine w reflex microscopic -Urine, Clean Catch     Status: Abnormal   Collection Time: 06/01/23  3:54 PM  Result Value Ref Range   Color, Urine AMBER (A) YELLOW    Comment: BIOCHEMICALS MAY BE AFFECTED BY COLOR   APPearance CLOUDY (A) CLEAR   Specific Gravity, Urine 1.030 1.005 - 1.030   pH 5.0 5.0 - 8.0   Glucose, UA NEGATIVE NEGATIVE mg/dL   Hgb urine dipstick NEGATIVE NEGATIVE   Bilirubin  Urine SMALL (A) NEGATIVE   Ketones, ur NEGATIVE NEGATIVE mg/dL   Protein, ur 952 (A) NEGATIVE mg/dL   Nitrite NEGATIVE NEGATIVE   Leukocytes,Ua TRACE (A) NEGATIVE   RBC / HPF 0-5 0 - 5 RBC/hpf   WBC, UA 11-20 0 - 5 WBC/hpf   Bacteria, UA RARE (A) NONE SEEN   Squamous Epithelial / HPF 0-5 0 - 5 /HPF   WBC Clumps PRESENT    Mucus PRESENT    Hyaline Casts, UA PRESENT    Granular Casts, UA PRESENT    Non Squamous Epithelial 0-5 (A) NONE SEEN    Comment: Performed at [facility], [address], [place], [place] [address]   CT ABDOMEN PELVIS W CONTRAST Result Date: 06/01/2023 CLINICAL DATA:  Left lower quadrant pain, nausea, and vomiting for four days. EXAM: CT ABDOMEN AND PELVIS WITH CONTRAST TECHNIQUE: Multidetector CT imaging of the abdomen and pelvis was performed using the standard protocol following bolus administration of intravenous contrast. RADIATION DOSE REDUCTION: This exam was performed according to the departmental dose-optimization program which includes automated exposure control, adjustment of the mA and/or kV according to patient size and/or use of iterative reconstruction technique. CONTRAST:  80mL OMNIPAQUE IOHEXOL 300 MG/ML  SOLN COMPARISON:  None Available. FINDINGS: Lower Chest: No acute findings. Hepatobiliary: No suspicious hepatic masses identified. Gallbladder is unremarkable. No evidence of biliary ductal dilatation. Pancreas:  No mass or inflammatory changes. Spleen: Within normal limits in size. Two sub-centimeter low-attenuation lesions are seen in the dome of the spleen which are too small to characterize, but likely represent benign hemangiomas Adrenals/Urinary Tract: No suspicious masses identified. No evidence of ureteral calculi or hydronephrosis. Stomach/Bowel: Moderate hiatal hernia. No evidence of bowel obstruction. Mild wall thickening and pneumatosis is seen involving small bowel in the left lower quadrant, with extraluminal gas tracking into  the adjacent left abdominal mesentery. This is consistent with enteritis no No evidence of free intraperitoneal air or abscess. Anterior abdominal wall surgical mesh seen, without recurrent hernia. Vascular/Lymphatic: No pathologically enlarged lymph nodes. No acute vascular findings. Reproductive:  No mass or other significant abnormality. Other:  None. Musculoskeletal:  No suspicious bone lesions identified. IMPRESSION: Small bowel wall thickening in  the left lower quadrant, with pneumatosis and extraluminal gas in the adjacent mesentery, likely due to acute enteritis with micro perforation. No evidence of abscess or bowel obstruction. Moderate hiatal hernia. Electronically Signed   By: Danae Orleans M.D.   On: 06/01/2023 17:32    Pending Labs Unresulted Labs (From admission, onward)     Start     Ordered   06/01/23 1918  HIV Antibody (routine testing w rflx)  (HIV Antibody (Routine testing w reflex) panel)  Once,   R        06/01/23 1919   06/01/23 1749  Blood culture (routine x 2)  BLOOD CULTURE X 2,   R (with STAT occurrences)      06/01/23 1749   06/01/23 1749  Lactic acid, plasma  (Lactic Acid)  Now then every 2 hours,   R (with STAT occurrences)      06/01/23 1749            Vitals/Pain Today's Vitals   06/01/23 1435 06/01/23 1442 06/01/23 1620 06/01/23 1927  BP:  98/62 123/61   Pulse:  81 77   Resp:  16 18   Temp:  98.2 F (36.8 C)  98.3 F (36.8 C)  TempSrc:  Oral  Oral  SpO2:  100% 99%   PainSc: 8        Isolation Precautions No active isolations  Medications Medications  piperacillin-tazobactam (ZOSYN) IVPB 3.375 g (3.375 g Intravenous New Bag/Given 06/01/23 1935)  enoxaparin (LOVENOX) injection 40 mg (has no administration in time range)  acetaminophen (TYLENOL) tablet 650 mg (has no administration in time range)    Or  acetaminophen (TYLENOL) suppository 650 mg (has no administration in time range)  senna-docusate (Senokot-S) tablet 1 tablet (has no  administration in time range)  ondansetron (ZOFRAN) tablet 4 mg (has no administration in time range)    Or  ondansetron (ZOFRAN) injection 4 mg (has no administration in time range)  ondansetron (ZOFRAN) injection 4 mg (4 mg Intravenous Given 06/01/23 1649)  lactated ringers bolus 1,000 mL (1,000 mLs Intravenous New Bag/Given 06/01/23 1650)  iohexol (OMNIPAQUE) 300 MG/ML solution 80 mL (80 mLs Intravenous Contrast Given 06/01/23 1702)    Mobility walks

## 2023-06-01 NOTE — H&P (Incomplete)
CC: abd pain  HPI: Adrienne Marsh is an 68 y.o. female who is here for left sided abd pain with nausea and vomiting that has been going on for ~4 days.  Has been on antibiotics for dental infections and taking ibuprofen.  Having episodic diarrhea.    Past Medical History:  Diagnosis Date   Diverticulitis    Essential hypertension    Hernia cerebri (HCC)    Supraventricular tachycardia, paroxysmal (HCC) 02/09/2014   Presented with near syncope broke with adenosine in the ER -    Uterine prolapse     Past Surgical History:  Procedure Laterality Date   ABDOMINAL HYSTERECTOMY     ABDOMINAL SURGERY     COLON RESECTION     uterine prolapse repair      Family History  Problem Relation Age of Onset   Cancer Mother    Cancer Father     Social:  reports that she has quit smoking. Her smoking use included cigarettes. She has a 10 pack-year smoking history. She has never used smokeless tobacco. She reports current alcohol use. She reports that she does not use drugs.  Allergies:  Allergies  Allergen Reactions   Codeine Other (See Comments)    "Makes me feel funny" and dizziness    Medications: I have reviewed the patient's current medications.  Results for orders placed or performed during the hospital encounter of 06/01/23 (from the past 48 hours)  Lipase, blood     Status: None   Collection Time: 06/01/23  3:01 PM  Result Value Ref Range   Lipase 27 11 - 51 U/L    Comment: Performed at [facility], [address] [place], [place] [address]  Comprehensive metabolic panel     Status: Abnormal   Collection Time: 06/01/23  3:01 PM  Result Value Ref Range   Sodium 140 135 - 145 mmol/L   Potassium 3.8 3.5 - 5.1 mmol/L   Chloride 111 98 - 111 mmol/L   CO2 19 (L) 22 - 32 mmol/L   Glucose, Bld 137 (H) 70 - 99 mg/dL    Comment: Glucose reference range applies only to samples taken after fasting for at least 8 hours.   BUN 37 (H) 8 - 23 mg/dL   Creatinine, Ser  3.08 (H) 0.44 - 1.00 mg/dL   Calcium 8.6 (L) 8.9 - 10.3 mg/dL   Total Protein 6.9 6.5 - 8.1 g/dL   Albumin 3.4 (L) 3.5 - 5.0 g/dL   AST 68 (H) 15 - 41 U/L   ALT 45 (H) 0 - 44 U/L   Alkaline Phosphatase 139 (H) 38 - 126 U/L   Total Bilirubin 1.3 (H) 0.0 - 1.2 mg/dL   GFR, Estimated 46 (L) >60 mL/min    Comment: (NOTE) Calculated using the CKD-EPI Creatinine Equation (2021)    Anion gap 10 5 - 15    Comment: Performed at [facility], [address] [place], [place] [address]  CBC     Status: Abnormal   Collection Time: 06/01/23  3:01 PM  Result Value Ref Range   WBC 12.9 (H) 4.0 - 10.5 K/uL   RBC 3.61 (L) 3.87 - 5.11 MIL/uL   Hemoglobin 10.6 (L) 12.0 - 15.0 g/dL   HCT 69.6 (L) 29.5 - 28.4 %   MCV 94.2 80.0 - 100.0 fL   MCH 29.4 26.0 - 34.0 pg   MCHC 31.2 30.0 - 36.0 g/dL   RDW 13.2 44.0 - 10.2 %  Platelets 299 150 - 400 K/uL   nRBC 0.0 0.0 - 0.2 %    Comment: Performed at [facility], [address] [place], [place] [address]  Urinalysis, Routine w reflex microscopic -Urine, Clean Catch     Status: Abnormal   Collection Time: 06/01/23  3:54 PM  Result Value Ref Range   Color, Urine AMBER (A) YELLOW    Comment: BIOCHEMICALS MAY BE AFFECTED BY COLOR   APPearance CLOUDY (A) CLEAR   Specific Gravity, Urine 1.030 1.005 - 1.030   pH 5.0 5.0 - 8.0   Glucose, UA NEGATIVE NEGATIVE mg/dL   Hgb urine dipstick NEGATIVE NEGATIVE   Bilirubin Urine SMALL (A) NEGATIVE   Ketones, ur NEGATIVE NEGATIVE mg/dL   Protein, ur 191 (A) NEGATIVE mg/dL   Nitrite NEGATIVE NEGATIVE   Leukocytes,Ua TRACE (A) NEGATIVE   RBC / HPF 0-5 0 - 5 RBC/hpf   WBC, UA 11-20 0 - 5 WBC/hpf   Bacteria, UA RARE (A) NONE SEEN   Squamous Epithelial / HPF 0-5 0 - 5 /HPF   WBC Clumps PRESENT    Mucus PRESENT    Hyaline Casts, UA PRESENT    Granular Casts, UA PRESENT    Non Squamous Epithelial 0-5 (A) NONE SEEN    Comment: Performed at [facility], [address] [place], [place] [address]  Lactic acid, plasma     Status: None   Collection Time: 06/01/23  7:25 PM  Result Value Ref Range   Lactic Acid, Venous 0.7 0.5 - 1.9 mmol/L    Comment: Performed at [facility], [address] [place], [place] [address]    CT ABDOMEN PELVIS W CONTRAST Result Date: 06/01/2023 CLINICAL DATA:  Left lower quadrant pain, nausea, and vomiting for four days. EXAM: CT ABDOMEN AND PELVIS WITH CONTRAST TECHNIQUE: Multidetector CT imaging of the abdomen and pelvis was performed using the standard protocol following bolus administration of intravenous contrast. RADIATION DOSE REDUCTION: This exam was performed according to the departmental dose-optimization program which includes automated exposure control, adjustment of the mA and/or kV according to patient size and/or use of iterative reconstruction technique. CONTRAST:  80mL OMNIPAQUE IOHEXOL 300 MG/ML  SOLN COMPARISON:  None Available. FINDINGS: Lower Chest: No acute findings. Hepatobiliary: No suspicious hepatic masses identified. Gallbladder is unremarkable. No evidence of biliary ductal dilatation. Pancreas:  No mass or inflammatory changes. Spleen: Within normal limits in size. Two sub-centimeter low-attenuation lesions are seen in the dome of the spleen which are too small to characterize, but likely represent benign hemangiomas Adrenals/Urinary Tract: No suspicious masses identified. No evidence of ureteral calculi or hydronephrosis. Stomach/Bowel: Moderate hiatal hernia. No evidence of bowel obstruction. Mild wall thickening and pneumatosis is seen involving small bowel in the left lower quadrant, with extraluminal gas tracking into the adjacent left abdominal mesentery. This is consistent with enteritis no No evidence of free intraperitoneal air or abscess. Anterior abdominal wall surgical mesh seen, without recurrent hernia. Vascular/Lymphatic: No pathologically enlarged lymph nodes. No acute  vascular findings. Reproductive:  No mass or other significant abnormality. Other:  None. Musculoskeletal:  No suspicious bone lesions identified. IMPRESSION: Small bowel wall thickening in the left lower quadrant, with pneumatosis and extraluminal gas in the adjacent mesentery, likely due to acute enteritis with micro perforation. No evidence of abscess or bowel obstruction. Moderate hiatal hernia. Electronically Signed   By: Danae Orleans M.D.   On: 06/01/2023 17:32    ROS - all of the below  systems have been reviewed with the patient and positives are indicated with bold text General: chills, fever or night sweats Eyes: blurry vision or double vision ENT: epistaxis or sore throat Allergy/Immunology: itchy/watery eyes or nasal congestion Hematologic/Lymphatic: bleeding problems, blood clots or swollen lymph nodes Endocrine: temperature intolerance or unexpected weight changes Resp: cough, shortness of breath, or wheezing CV: chest pain or dyspnea on exertion GI: as per HPI GU: dysuria, trouble voiding, or hematuria MSK: joint pain or joint stiffness Neuro: TIA or stroke symptoms Derm: pruritus and skin lesion changes Psych: anxiety and depression  PE Blood pressure (!) 149/63, pulse 76, temperature 98.3 F (36.8 C), temperature source Oral, resp. rate 20, SpO2 95%. Constitutional: NAD; conversant; no deformities Eyes: Moist conjunctiva; no lid lag; anicteric; PERRL Neck: Trachea midline; no thyromegaly Lungs: Normal respiratory effort; no tactile fremitus CV: RRR; no palpable thrills; no pitting edema GI: Abd soft, mild TTP LLQ; no palpable hepatosplenomegaly MSK: Normal range of motion of extremities; no clubbing/cyanosis Psychiatric: Appropriate affect; alert and oriented x3 Lymphatic: No palpable cervical or axillary lymphadenopathy  Results for orders placed or performed during the hospital encounter of 06/01/23 (from the past 48 hours)  Lipase, blood     Status: None    Collection Time: 06/01/23  3:01 PM  Result Value Ref Range   Lipase 27 11 - 51 U/L    Comment: Performed at [facility], [address], [place], [place] [address]  Comprehensive metabolic panel     Status: Abnormal   Collection Time: 06/01/23  3:01 PM  Result Value Ref Range   Sodium 140 135 - 145 mmol/L   Potassium 3.8 3.5 - 5.1 mmol/L   Chloride 111 98 - 111 mmol/L   CO2 19 (L) 22 - 32 mmol/L   Glucose, Bld 137 (H) 70 - 99 mg/dL    Comment: Glucose reference range applies only to samples taken after fasting for at least 8 hours.   BUN 37 (H) 8 - 23 mg/dL   Creatinine, Ser 4.78 (H) 0.44 - 1.00 mg/dL   Calcium 8.6 (L) 8.9 - 10.3 mg/dL   Total Protein 6.9 6.5 - 8.1 g/dL   Albumin 3.4 (L) 3.5 - 5.0 g/dL   AST 68 (H) 15 - 41 U/L   ALT 45 (H) 0 - 44 U/L   Alkaline Phosphatase 139 (H) 38 - 126 U/L   Total Bilirubin 1.3 (H) 0.0 - 1.2 mg/dL   GFR, Estimated 46 (L) >60 mL/min    Comment: (NOTE) Calculated using the CKD-EPI Creatinine Equation (2021)    Anion gap 10 5 - 15    Comment: Performed at [facility], [address] [place], [place] [address]  CBC     Status: Abnormal   Collection Time: 06/01/23  3:01 PM  Result Value Ref Range   WBC 12.9 (H) 4.0 - 10.5 K/uL   RBC 3.61 (L) 3.87 - 5.11 MIL/uL   Hemoglobin 10.6 (L) 12.0 - 15.0 g/dL   HCT 13.0 (L) 86.5 - 78.4 %   MCV 94.2 80.0 - 100.0 fL   MCH 29.4 26.0 - 34.0 pg   MCHC 31.2 30.0 - 36.0 g/dL   RDW 69.6 29.5 - 28.4 %   Platelets 299 150 - 400 K/uL   nRBC 0.0 0.0 - 0.2 %    Comment: Performed at [facility], [address], [place], [place] [address]  Urinalysis, Routine w reflex microscopic -Urine, Clean Catch  Status: Abnormal   Collection Time: 06/01/23  3:54 PM  Result Value Ref Range   Color, Urine AMBER (A) YELLOW    Comment: BIOCHEMICALS MAY BE AFFECTED BY COLOR   APPearance CLOUDY (A) CLEAR   Specific Gravity, Urine 1.030 1.005 - 1.030   pH 5.0 5.0 -  8.0   Glucose, UA NEGATIVE NEGATIVE mg/dL   Hgb urine dipstick NEGATIVE NEGATIVE   Bilirubin Urine SMALL (A) NEGATIVE   Ketones, ur NEGATIVE NEGATIVE mg/dL   Protein, ur 782 (A) NEGATIVE mg/dL   Nitrite NEGATIVE NEGATIVE   Leukocytes,Ua TRACE (A) NEGATIVE   RBC / HPF 0-5 0 - 5 RBC/hpf   WBC, UA 11-20 0 - 5 WBC/hpf   Bacteria, UA RARE (A) NONE SEEN   Squamous Epithelial / HPF 0-5 0 - 5 /HPF   WBC Clumps PRESENT    Mucus PRESENT    Hyaline Casts, UA PRESENT    Granular Casts, UA PRESENT    Non Squamous Epithelial 0-5 (A) NONE SEEN    Comment: Performed at [facility], [address] [place], [place] [address]  Lactic acid, plasma     Status: None   Collection Time: 06/01/23  7:25 PM  Result Value Ref Range   Lactic Acid, Venous 0.7 0.5 - 1.9 mmol/L    Comment: Performed at [facility], [address], [place], [place] [address]    CT ABDOMEN PELVIS W CONTRAST Result Date: 06/01/2023 CLINICAL DATA:  Left lower quadrant pain, nausea, and vomiting for four days. EXAM: CT ABDOMEN AND PELVIS WITH CONTRAST TECHNIQUE: Multidetector CT imaging of the abdomen and pelvis was performed using the standard protocol following bolus administration of intravenous contrast. RADIATION DOSE REDUCTION: This exam was performed according to the departmental dose-optimization program which includes automated exposure control, adjustment of the mA and/or kV according to patient size and/or use of iterative reconstruction technique. CONTRAST:  80mL OMNIPAQUE IOHEXOL 300 MG/ML  SOLN COMPARISON:  None Available. FINDINGS: Lower Chest: No acute findings. Hepatobiliary: No suspicious hepatic masses identified. Gallbladder is unremarkable. No evidence of biliary ductal dilatation. Pancreas:  No mass or inflammatory changes. Spleen: Within normal limits in size. Two sub-centimeter low-attenuation lesions are seen in the dome of the spleen which are too small to characterize, but  likely represent benign hemangiomas Adrenals/Urinary Tract: No suspicious masses identified. No evidence of ureteral calculi or hydronephrosis. Stomach/Bowel: Moderate hiatal hernia. No evidence of bowel obstruction. Mild wall thickening and pneumatosis is seen involving small bowel in the left lower quadrant, with extraluminal gas tracking into the adjacent left abdominal mesentery. This is consistent with enteritis no No evidence of free intraperitoneal air or abscess. Anterior abdominal wall surgical mesh seen, without recurrent hernia. Vascular/Lymphatic: No pathologically enlarged lymph nodes. No acute vascular findings. Reproductive:  No mass or other significant abnormality. Other:  None. Musculoskeletal:  No suspicious bone lesions identified. IMPRESSION: Small bowel wall thickening in the left lower quadrant, with pneumatosis and extraluminal gas in the adjacent mesentery, likely due to acute enteritis with micro perforation. No evidence of abscess or bowel obstruction. Moderate hiatal hernia. Electronically Signed   By: Danae Orleans M.D.   On: 06/01/2023 17:32    I have personally reviewed the relevant CT images and lab work     A/P: Adrienne Marsh is an 68 y.o. female with LLQ pain and enteritis with no signs of perforation (just microperforation).   Would treat the same as enteritis.  No need for surgical resection.  Will sign off.  Please call with questions or concerns  I spent a total of 63 minutes in both face-to-face and non-face-to-face activities, excluding procedures performed, for this visit on the date of this encounter.  Moderate MDM  Vanita Panda, MD  Colorectal and General Surgery [facility]

## 2023-06-01 NOTE — H&P (Signed)
History and Physical  Adrienne Marsh ZOX:[id number] DOB: 1956-02-18 DOA: 06/01/2023  PCP: [name], NP   Chief Complaint: Abdominal pain, N/V  HPI: Adrienne Marsh is a 68 y.o. female with medical history significant for PTSD, anxiety and depression, panic attacks, HLD, HTN, paroxysmal SVT, hiatal hernia, urge incontinence, diverticulitis s/p colon resection and gastritis who presented to the ED for evaluation of abdominal pain, nausea and vomiting. Patient reports that on Saturday, she had a dental extraction and was given a "strong antibiotics" and pain medication that she thinks upsetting her stomach. She reports having abdominal pain, worse in her LLQ as well as constipation for the last 4 days. She took Aleve and Motrin without significant improvement. She also reports associated nausea and vomiting with 2-3 episodes of nonbloody, nonbilious emesis the last few days. She denies any fevers, chills, diarrhea, chest pain, shortness of breath, dysuria, headache or dizziness. Reports she is hungry and would like to eat.  ED Course: Stable vitals.  Labs significant for WBC of 12.9, Hgb of 10.6, bicarb 19, creatinine of 1.28, AST/ALT 68/45, alk phos 139, bilirubin 1.3, lipase 27, lactic acid 0.7, UA shows mild bilirubinuria, moderate proteinuria, trace leuks, negative nitrite, WBC 11-20 and rare bacteria.  CT A/P shows evidence of acute enteritis with microperforation but no evidence of abscess or bowel obstruction.  General surgery was consulted for evaluation [facility] was consulted for admission  Review of Systems: Please see HPI for pertinent positives and negatives. A complete 10 system review of systems are otherwise negative.  Past Medical History:  Diagnosis Date   Diverticulitis    Essential hypertension    Hernia cerebri (HCC)    Supraventricular tachycardia, paroxysmal (HCC) 02/09/2014   Presented with near syncope broke with adenosine in the ER -    Uterine prolapse    Past Surgical  History:  Procedure Laterality Date   ABDOMINAL HYSTERECTOMY     ABDOMINAL SURGERY     COLON RESECTION     uterine prolapse repair     Social History:  reports that she has quit smoking. Her smoking use included cigarettes. She has a 10 pack-year smoking history. She has never used smokeless tobacco. She reports current alcohol use. She reports that she does not use drugs.  Allergies  Allergen Reactions   Codeine Other (See Comments)    "Makes me feel funny" and dizziness    Family History  Problem Relation Age of Onset   Cancer Mother    Cancer Father      Prior to Admission medications   Medication Sig Start Date End Date Taking? Authorizing Provider  aluminum chloride (DRYSOL) 20 % external solution Apply 1 application  topically at bedtime as needed (to underarms, for sweating).   Yes [provider]  amoxicillin (AMOXIL) 875 MG tablet Take 875 mg by mouth 2 (two) times daily with a meal. 05/26/23 06/05/23 Yes [provider]  atorvastatin (LIPITOR) 20 MG tablet Take 20 mg by mouth daily. 05/20/18  Yes [provider]  cyclobenzaprine (FLEXERIL) 5 MG tablet Take 5 mg by mouth 3 (three) times daily as needed for muscle spasms (in the back).   Yes [provider]  lisinopril (PRINIVIL,ZESTRIL) 10 MG tablet Take 10 mg by mouth daily.   Yes [provider]  LORazepam (ATIVAN) 1 MG tablet Takes 1 in the morning and 2 at hs prn anxiety Patient taking differently: Take 1-2 mg by mouth See admin instructions. Take 1 mg by mouth in the morning  and 2 mg at bedtime 09/10/18  Yes Cottle, Steva Ready., MD  metoprolol tartrate (LOPRESSOR) 25 MG tablet Take 0.5 tablets (12.5 mg total) by mouth 2 (two) times daily. Patient taking differently: Take 12.5-25 mg by mouth in the morning. Take 12.5 mg by mouth in the morning & at bedtime AND an additional 25 mg once a day as directed/as needed for SVT symptoms 08/21/18  Yes Loren Racer, MD  Multiple Vitamin  (MULTIVITAMIN WITH MINERALS) TABS tablet Take 1 tablet by mouth daily with breakfast.   Yes [provider]  MYRBETRIQ 25 MG TB24 tablet Take 25 mg by mouth at bedtime.   Yes [provider]  pantoprazole (PROTONIX) 40 MG tablet Take 40 mg by mouth at bedtime.   Yes [provider]  prazosin (MINIPRESS) 1 MG capsule Take 1 mg by mouth at bedtime.   Yes [provider]  sertraline (ZOLOFT) 100 MG tablet Take 2 tablets (200 mg total) by mouth daily. 07/15/18  Yes Shugart, Mat Carne, PA-C  TYLENOL 500 MG tablet Take 1,000 mg by mouth every 6 (six) hours as needed for headache or mild pain (pain score 1-3).   Yes [provider]  ZANTAC 360 MAX ST 20 MG tablet Take 20 mg by mouth 2 (two) times daily as needed for heartburn or indigestion.   Yes [provider]  busPIRone (BUSPAR) 15 MG tablet 1 bid Patient not taking: Reported on 06/01/2023 07/15/18   Anne Fu, PA-C    Physical Exam: BP (!) 164/56 (BP Location: Left Arm)   Pulse 79   Temp 99.2 F (37.3 C) (Oral)   Resp 18   SpO2 100%  General: Pleasant, well-appearing elderly woman laying in bed. No acute distress. HEENT: [place]/AT. Anicteric sclera. Dry mucous membrane. CV: RRR. No murmurs, rubs, or gallops. No LE edema Pulmonary: Lungs CTAB. Normal effort. No wheezing or rales. Abdominal: Soft, nondistended. Mild tenderness palpation of the LLQ. No guarding. Normal bowel sounds. Extremities: Palpable radial and DP pulses. Normal ROM. Skin: Warm and dry. No obvious rash or lesions. Neuro: A&Ox3. Moves all extremities. Normal sensation to light touch. No focal deficit. Psych: Normal mood and affect          Labs on Admission:  Basic Metabolic Panel: Recent Labs  Lab 06/01/23 1501  NA 140  K 3.8  CL 111  CO2 19*  GLUCOSE 137*  BUN 37*  CREATININE 1.28*  CALCIUM 8.6*   Liver Function Tests: Recent Labs  Lab 06/01/23 1501  AST 68*  ALT 45*  ALKPHOS 139*  BILITOT 1.3*  PROT 6.9   ALBUMIN 3.4*   Recent Labs  Lab 06/01/23 1501  LIPASE 27   No results for input(s): "AMMONIA" in the last 168 hours. CBC: Recent Labs  Lab 06/01/23 1501  WBC 12.9*  HGB 10.6*  HCT 34.0*  MCV 94.2  PLT 299   Cardiac Enzymes: No results for input(s): "CKTOTAL", "CKMB", "CKMBINDEX", "TROPONINI" in the last 168 hours. BNP (last 3 results) No results for input(s): "BNP" in the last 8760 hours.  ProBNP (last 3 results) No results for input(s): "PROBNP" in the last 8760 hours.  CBG: No results for input(s): "GLUCAP" in the last 168 hours.  Radiological Exams on Admission: CT ABDOMEN PELVIS W CONTRAST Result Date: 06/01/2023 CLINICAL DATA:  Left lower quadrant pain, nausea, and vomiting for four days. EXAM: CT ABDOMEN AND PELVIS WITH CONTRAST TECHNIQUE: Multidetector CT imaging of the abdomen and pelvis was performed using the standard protocol  following bolus administration of intravenous contrast. RADIATION DOSE REDUCTION: This exam was performed according to the departmental dose-optimization program which includes automated exposure control, adjustment of the mA and/or kV according to patient size and/or use of iterative reconstruction technique. CONTRAST:  80mL OMNIPAQUE IOHEXOL 300 MG/ML  SOLN COMPARISON:  None Available. FINDINGS: Lower Chest: No acute findings. Hepatobiliary: No suspicious hepatic masses identified. Gallbladder is unremarkable. No evidence of biliary ductal dilatation. Pancreas:  No mass or inflammatory changes. Spleen: Within normal limits in size. Two sub-centimeter low-attenuation lesions are seen in the dome of the spleen which are too small to characterize, but likely represent benign hemangiomas Adrenals/Urinary Tract: No suspicious masses identified. No evidence of ureteral calculi or hydronephrosis. Stomach/Bowel: Moderate hiatal hernia. No evidence of bowel obstruction. Mild wall thickening and pneumatosis is seen involving small bowel in the left lower  quadrant, with extraluminal gas tracking into the adjacent left abdominal mesentery. This is consistent with enteritis no No evidence of free intraperitoneal air or abscess. Anterior abdominal wall surgical mesh seen, without recurrent hernia. Vascular/Lymphatic: No pathologically enlarged lymph nodes. No acute vascular findings. Reproductive:  No mass or other significant abnormality. Other:  None. Musculoskeletal:  No suspicious bone lesions identified. IMPRESSION: Small bowel wall thickening in the left lower quadrant, with pneumatosis and extraluminal gas in the adjacent mesentery, likely due to acute enteritis with micro perforation. No evidence of abscess or bowel obstruction. Moderate hiatal hernia. Electronically Signed   By: Danae Orleans M.D.   On: 06/01/2023 17:32   Assessment/Plan Adrienne Marsh is a 68 y.o. female with medical history significant for PTSD, anxiety and depression, panic attacks, HLD, HTN, paroxysmal SVT, hiatal hernia, urge incontinence, diverticulitis s/p colon resection and gastritis who presented to the ED for evaluation of abdominal pain, nausea and vomiting and admitted for acute enteritis.  # Acute enteritis with microperforation Patient with a history of diverticulitis s/p colon resection presenting with 3 to 4 days of left lower abdominal pain, constipation, nausea and vomiting and found to have evidence of acute enteritis on abdominal imaging. Patient hemodynamically stable. She was able to keep food down in the ER. -General Surgery following, appreciate recs -Continue IV Zosyn -IV Dilaudid as needed for pain -Follow-up blood culture -Trend CBC, fever curve  # AKI Bump in creatinine to 1.28 in the setting of dehydration from recent nausea and vomiting. S/p 1 L IV fluid bolus in the ED.  Patient now tolerating p.o. intake. -Continue to encourage oral fluid intake -Follow-up morning BMP  # HTN BP elevated with SBP in the 130s to 160s likely secondary to abdominal  pain. -Continue Lopressor -Resume lisinopril tomorrow with improvement in AKI  # HLD -Continue atorvastatin  # PTSD # Anxiety and depression -Continue sertraline, prazosin and Ativan  # GERD # Hiatal hernia # Gastritis -Continue Protonix and as needed Pepcid  # Urge incontinence -Continue Myrbetriq  DVT prophylaxis: Lovenox     Code Status: Full Code  Consults called: General Surgery  Family Communication: No family at bedside  Severity of Illness: The appropriate patient status for this patient is OBSERVATION. Observation status is judged to be reasonable and necessary in order to provide the required intensity of service to ensure the patient's safety. The patient's presenting symptoms, physical exam findings, and initial radiographic and laboratory data in the context of their medical condition is felt to place them at decreased risk for further clinical deterioration. Furthermore, it is anticipated that the patient will be medically stable for discharge from  the hospital within 2 midnights of admission.   Level of care: Med-Surg   Steffanie Rainwater, MD 06/01/2023, 10:23 PM [facility] Pager: [phone] Isaiah 41:[id number]   If 7PM-7AM, please contact night-coverage [email or web address] Password [email or web address]

## 2023-06-01 NOTE — ED Triage Notes (Signed)
Pt here from home with c/o abd pain left side along with some n/v , has been ongoing for 4 days

## 2023-06-02 DIAGNOSIS — R109 Unspecified abdominal pain: Secondary | ICD-10-CM | POA: Diagnosis present

## 2023-06-02 DIAGNOSIS — Z90711 Acquired absence of uterus with remaining cervical stump: Secondary | ICD-10-CM | POA: Diagnosis not present

## 2023-06-02 DIAGNOSIS — K2970 Gastritis, unspecified, without bleeding: Secondary | ICD-10-CM | POA: Diagnosis present

## 2023-06-02 DIAGNOSIS — Z885 Allergy status to narcotic agent status: Secondary | ICD-10-CM | POA: Diagnosis not present

## 2023-06-02 DIAGNOSIS — Z79899 Other long term (current) drug therapy: Secondary | ICD-10-CM | POA: Diagnosis not present

## 2023-06-02 DIAGNOSIS — R112 Nausea with vomiting, unspecified: Secondary | ICD-10-CM | POA: Diagnosis not present

## 2023-06-02 DIAGNOSIS — K529 Noninfective gastroenteritis and colitis, unspecified: Secondary | ICD-10-CM | POA: Diagnosis not present

## 2023-06-02 DIAGNOSIS — N179 Acute kidney failure, unspecified: Secondary | ICD-10-CM | POA: Diagnosis present

## 2023-06-02 DIAGNOSIS — E860 Dehydration: Secondary | ICD-10-CM | POA: Diagnosis present

## 2023-06-02 DIAGNOSIS — I10 Essential (primary) hypertension: Secondary | ICD-10-CM | POA: Diagnosis present

## 2023-06-02 DIAGNOSIS — K449 Diaphragmatic hernia without obstruction or gangrene: Secondary | ICD-10-CM | POA: Diagnosis present

## 2023-06-02 DIAGNOSIS — N3941 Urge incontinence: Secondary | ICD-10-CM | POA: Diagnosis present

## 2023-06-02 DIAGNOSIS — Z9049 Acquired absence of other specified parts of digestive tract: Secondary | ICD-10-CM | POA: Diagnosis not present

## 2023-06-02 DIAGNOSIS — K219 Gastro-esophageal reflux disease without esophagitis: Secondary | ICD-10-CM | POA: Diagnosis present

## 2023-06-02 DIAGNOSIS — E785 Hyperlipidemia, unspecified: Secondary | ICD-10-CM | POA: Diagnosis present

## 2023-06-02 DIAGNOSIS — I4719 Other supraventricular tachycardia: Secondary | ICD-10-CM | POA: Diagnosis present

## 2023-06-02 DIAGNOSIS — R822 Biliuria: Secondary | ICD-10-CM | POA: Diagnosis present

## 2023-06-02 DIAGNOSIS — R197 Diarrhea, unspecified: Secondary | ICD-10-CM | POA: Diagnosis not present

## 2023-06-02 DIAGNOSIS — Z87891 Personal history of nicotine dependence: Secondary | ICD-10-CM | POA: Diagnosis not present

## 2023-06-02 DIAGNOSIS — Z8719 Personal history of other diseases of the digestive system: Secondary | ICD-10-CM | POA: Diagnosis not present

## 2023-06-02 DIAGNOSIS — R809 Proteinuria, unspecified: Secondary | ICD-10-CM | POA: Diagnosis present

## 2023-06-02 DIAGNOSIS — A09 Infectious gastroenteritis and colitis, unspecified: Secondary | ICD-10-CM | POA: Diagnosis present

## 2023-06-02 DIAGNOSIS — K828 Other specified diseases of gallbladder: Secondary | ICD-10-CM | POA: Diagnosis present

## 2023-06-02 DIAGNOSIS — F419 Anxiety disorder, unspecified: Secondary | ICD-10-CM | POA: Diagnosis present

## 2023-06-02 DIAGNOSIS — F32A Depression, unspecified: Secondary | ICD-10-CM | POA: Diagnosis present

## 2023-06-02 DIAGNOSIS — F4310 Post-traumatic stress disorder, unspecified: Secondary | ICD-10-CM | POA: Diagnosis present

## 2023-06-02 DIAGNOSIS — E669 Obesity, unspecified: Secondary | ICD-10-CM | POA: Diagnosis present

## 2023-06-02 DIAGNOSIS — K631 Perforation of intestine (nontraumatic): Secondary | ICD-10-CM | POA: Diagnosis present

## 2023-06-02 LAB — CBC
HCT: 31.1 % — ABNORMAL LOW (ref 36.0–46.0)
Hemoglobin: 9.8 g/dL — ABNORMAL LOW (ref 12.0–15.0)
MCH: 29.7 pg (ref 26.0–34.0)
MCHC: 31.5 g/dL (ref 30.0–36.0)
MCV: 94.2 fL (ref 80.0–100.0)
Platelets: 225 10*3/uL (ref 150–400)
RBC: 3.3 MIL/uL — ABNORMAL LOW (ref 3.87–5.11)
RDW: 13.7 % (ref 11.5–15.5)
WBC: 10.1 10*3/uL (ref 4.0–10.5)
nRBC: 0 % (ref 0.0–0.2)

## 2023-06-02 LAB — BASIC METABOLIC PANEL
Anion gap: 10 (ref 5–15)
BUN: 34 mg/dL — ABNORMAL HIGH (ref 8–23)
CO2: 22 mmol/L (ref 22–32)
Calcium: 8.5 mg/dL — ABNORMAL LOW (ref 8.9–10.3)
Chloride: 107 mmol/L (ref 98–111)
Creatinine, Ser: 0.97 mg/dL (ref 0.44–1.00)
GFR, Estimated: >60 mL/min (ref >60)
Glucose, Bld: 105 mg/dL — ABNORMAL HIGH (ref 70–99)
Potassium: 3.6 mmol/L (ref 3.5–5.1)
Sodium: 139 mmol/L (ref 135–145)

## 2023-06-02 LAB — PHOSPHORUS: Phosphorus: 2.6 mg/dL (ref 2.5–4.6)

## 2023-06-02 LAB — HIV ANTIBODY (ROUTINE TESTING W REFLEX): HIV Screen 4th Generation wRfx: NONREACTIVE

## 2023-06-02 LAB — MAGNESIUM: Magnesium: 2 mg/dL (ref 1.7–2.4)

## 2023-06-02 MED ORDER — MELATONIN 5 MG PO TABS
5.0000 mg | ORAL_TABLET | Freq: Once | ORAL | Status: DC
  Filled 2023-06-02: qty 1

## 2023-06-02 MED ORDER — LISINOPRIL 10 MG PO TABS
10.0000 mg | ORAL_TABLET | Freq: Every day | ORAL | Status: DC
  Administered 2023-06-02 – 2023-06-08 (×7): 10 mg via ORAL
  Filled 2023-06-02 (×7): qty 1

## 2023-06-02 MED ORDER — PIPERACILLIN-TAZOBACTAM 3.375 G IVPB
3.3750 g | Freq: Three times a day (TID) | INTRAVENOUS | Status: DC
  Administered 2023-06-02 – 2023-06-07 (×17): 3.375 g via INTRAVENOUS
  Filled 2023-06-02 (×17): qty 50

## 2023-06-02 NOTE — Progress Notes (Signed)
Pharmacy Antibiotic Note  Leler Brion is a 68 y.o. female admitted on 06/01/2023 with intra-abdominal.  Pharmacy has been consulted for zosyn dosing.  Plan: Zosyn 3.375g IV q8h (4 hour infusion). Pharmacy will sign off and follow peripherally  Height: 5\' 11"  (180.3 cm) Weight: 81.6 kg (179 lb 14.3 oz) IBW/kg (Calculated) : 70.8  Temp (24hrs), Avg:98.5 F (36.9 C), Min:98.2 F (36.8 C), Max:99.2 F (37.3 C)  Recent Labs  Lab 06/01/23 1501 06/01/23 1925  WBC 12.9*  --   CREATININE 1.28*  --   LATICACIDVEN  --  0.7    Estimated Creatinine Clearance: 49 mL/min (A) (by C-G formula based on SCr of 1.28 mg/dL (H)).    Allergies  Allergen Reactions   Codeine Other (See Comments)    "Makes me feel funny" and dizziness     Thank you for allowing pharmacy to be a part of this patient's care.  Arley Phenix RPh 06/02/2023, 12:01 AM

## 2023-06-02 NOTE — Progress Notes (Addendum)
MEWS Progress Note  Patient Details Name: Adrienne Marsh MRN: [id number] DOB: [birth year] Today's Date: 06/02/2023   MEWS Flowsheet Documentation:  Assess: MEWS Score Temp: 99.5 F (37.5 C) BP: (!) 145/64 MAP (mmHg): 87 Pulse Rate: (!) 146 Resp: 17 Level of Consciousness: Alert SpO2: 99 % O2 Device: Room Air Assess: MEWS Score MEWS Temp: 0 MEWS Systolic: 0 MEWS Pulse: 3 MEWS RR: 0 MEWS LOC: 0 MEWS Score: 3 MEWS Score Color: Yellow Assess: SIRS CRITERIA SIRS Temperature : 0 SIRS Respirations : 0 SIRS Pulse: 1 SIRS WBC: 0 SIRS Score Sum : 1 SIRS Temperature : 0 SIRS Pulse: 1 SIRS Respirations : 0 SIRS WBC: 0 SIRS Score Sum : 1 Assess: if the MEWS score is Yellow or Red Were vital signs accurate and taken at a resting state?: Yes Does the patient meet 2 or more of the SIRS criteria?: No MEWS guidelines implemented : Yes, yellow Treat MEWS Interventions: Considered administering scheduled or prn medications/treatments as ordered Take Vital Signs Increase Vital Sign Frequency : Yellow: Q2hr x1, continue Q4hrs until patient remains green for 12hrs Escalate MEWS: Escalate: Yellow: Discuss with charge nurse and consider notifying provider and/or RRT Notify: Charge Nurse/RN Name of Charge Nurse/RN Notified: Trudee Grip, RN Provider Notification Provider Name/Title: A[place],MD Date Provider Notified: 06/02/23 Time Provider Notified: 2234 Method of Notification: Page Notification Reason: Change in status Provider response: Evaluate remotely Date of Provider Response: 06/02/23 Time of Provider Response: 2234 Notify: Rapid Response Name of Rapid Response RN Notified:  (not needed)  Pt has a known history of SVT, given prn medication. Plan of care ongoing.      Zane Herald B 06/02/2023, 10:50 PM

## 2023-06-02 NOTE — Progress Notes (Signed)
PROGRESS NOTE                                                                                                                                                                                                             Patient Demographics:    Adrienne Marsh, is a 68 y.o. female, DOB - July 26, 1955, ZOX:[id number]  Outpatient Primary MD for the patient is [name], NP    LOS - 0  Admit date - 06/01/2023    Chief Complaint  Patient presents with   Abdominal Pain   Emesis       Brief Narrative (HPI from H&P)   Adrienne Marsh is a 68 y.o. female with medical history significant for PTSD, anxiety and depression, panic attacks, HLD, HTN, paroxysmal SVT, hiatal hernia, urge incontinence, diverticulitis s/p colon resection and gastritis who presented to the ED for evaluation of abdominal pain, nausea and vomiting.  CT A/P on admission showed evidence of acute cystitis with microperforation but no evidence of abscess or bowel obstruction.  General surgery was consulted and patient was started on IV Zosyn.   Subjective:    Adrienne Marsh today was evaluated laying comfortably in bed.  She continues to complain of left lower quadrant pain but was able to tolerate breakfast this morning without nausea and vomiting.  No fevers, chills or diarrhea.    Assessment  & Plan :    Assessment and Plan:  # Acute enteritis with microperforation She continues to have LLQ pain. Tolerating p.o. intake without nausea or vomiting this morning. Leukocytosis has resolved, she remains afebrile.  No surgical intervention per general surgery, signed off today. -Continue IV Zosyn -IV Dilaudid as needed for pain -Blood cultures no growth after 24 hours -Anticipate discharge home tomorrow with improvement in pain   # AKI, resolved -Continue to encourage oral fluid intake   # HTN BP improved with SBP in the 130s -Continue Lopressor -Resume home  lisinopril   # HLD -Continue atorvastatin   # PTSD # Anxiety and depression -Continue sertraline, prazosin and Ativan   # GERD # Hiatal hernia # Gastritis -Continue Protonix and as needed Pepcid   # Urge incontinence -Continue Myrbetriq   Obesity: Estimated body mass index is 25.09 kg/m as calculated from the following:   Height as of this encounter: 5\' 11"  (1.803 m).  Weight as of this encounter: 81.6 kg.          Condition -improving  Family Communication  : No family at bedside  Code Status : Full code  Consults  : General Surgery, signed off on 1/25  PUD Prophylaxis : Protonix   Procedures  :     None      Disposition Plan  :    Status is: Observation The patient will require care spanning > 2 midnights and should be moved to inpatient because: Ongoing abdominal pain and IV antibiotics for enteritis.  DVT Prophylaxis  :    enoxaparin (LOVENOX) injection 40 mg Start: 06/01/23 2200   Lab Results  Component Value Date   PLT 225 06/02/2023    Diet :  Diet Order             Diet regular Room service appropriate? Yes; Fluid consistency: Thin  Diet effective now                    Inpatient Medications  Scheduled Meds:  atorvastatin  20 mg Oral Daily   enoxaparin (LOVENOX) injection  40 mg Subcutaneous Q24H   metoprolol tartrate  12.5-25 mg Oral Daily   mirabegron ER  25 mg Oral QHS   multivitamin with minerals  1 tablet Oral Q breakfast   pantoprazole  40 mg Oral QHS   prazosin  1 mg Oral QHS   sertraline  200 mg Oral Daily   Continuous Infusions:  piperacillin-tazobactam (ZOSYN)  IV 3.375 g (06/02/23 0313)   PRN Meds:.acetaminophen **OR** acetaminophen, famotidine, HYDROmorphone (DILAUDID) injection, LORazepam, ondansetron **OR** ondansetron (ZOFRAN) IV, senna-docusate  Antibiotics  :    Anti-infectives (From admission, onward)    Start     Dose/Rate Route Frequency Ordered Stop   06/02/23 0400  piperacillin-tazobactam  (ZOSYN) IVPB 3.375 g        3.375 g 12.5 mL/hr over 240 Minutes Intravenous Every 8 hours 06/02/23 0000     06/01/23 1800  piperacillin-tazobactam (ZOSYN) IVPB 3.375 g        3.375 g 100 mL/hr over 30 Minutes Intravenous  Once 06/01/23 1749 06/01/23 2025         Objective:   Vitals:   06/01/23 2300 06/02/23 0153 06/02/23 0611 06/02/23 0611  BP:  (!) 103/46 (!) 135/55   Pulse:  80 82   Resp:  18 18   Temp:  99 F (37.2 C) 99.2 F (37.3 C) 98.6 F (37 C)  TempSrc:  Oral Oral Oral  SpO2:  95% 95%   Weight: 81.6 kg     Height: 5\' 11"  (1.803 m)       Wt Readings from Last 3 Encounters:  06/01/23 81.6 kg  02/04/22 81.6 kg  08/21/18 86.2 kg     Intake/Output Summary (Last 24 hours) at 06/02/2023 0945 Last data filed at 06/02/2023 0656 Gross per 24 hour  Intake 240 ml  Output 600 ml  Net -360 ml     Physical Exam  General: Pleasant, well-appearing elderly woman laying in bed. No acute distress. HEENT: /AT. Anicteric sclera CV: RRR. No murmurs, rubs, or gallops. No LE edema Pulmonary: Lungs CTAB. Normal effort. No wheezing or rales. Abdominal: Soft, nondistended. Mild ttp of LLQ. Normal bowel sounds. Extremities: Palpable radial and DP pulses. Normal ROM. Skin: Warm and dry. No obvious rash or lesions. Neuro: A&Ox3. Moves all extremities. Normal sensation to light touch. No focal deficit. Psych: Normal mood and affect  RN pressure injury documentation:      Data Review:    Recent Labs  Lab 06/01/23 1501 06/02/23 0514  WBC 12.9* 10.1  HGB 10.6* 9.8*  HCT 34.0* 31.1*  PLT 299 225  MCV 94.2 94.2  MCH 29.4 29.7  MCHC 31.2 31.5  RDW 13.5 13.7    Recent Labs  Lab 06/01/23 1501 06/01/23 1925 06/02/23 0514  NA 140  --  139  K 3.8  --  3.6  CL 111  --  107  CO2 19*  --  22  ANIONGAP 10  --  10  GLUCOSE 137*  --  105*  BUN 37*  --  34*  CREATININE 1.28*  --  0.97  AST 68*  --   --   ALT 45*  --   --   ALKPHOS 139*  --   --   BILITOT 1.3*  --    --   ALBUMIN 3.4*  --   --   LATICACIDVEN  --  0.7  --   MG  --   --  2.0  CALCIUM 8.6*  --  8.5*      Recent Labs  Lab 06/01/23 1501 06/01/23 1925 06/02/23 0514  LATICACIDVEN  --  0.7  --   MG  --   --  2.0  CALCIUM 8.6*  --  8.5*    --------------------------------------------------------------------------------------------------------------- No results found for: "CHOL", "HDL", "LDLCALC", "LDLDIRECT", "TRIG", "CHOLHDL"  No results found for: "HGBA1C" No results for input(s): "TSH", "T4TOTAL", "FREET4", "T3FREE", "THYROIDAB" in the last 72 hours. No results for input(s): "VITAMINB12", "FOLATE", "FERRITIN", "TIBC", "IRON", "RETICCTPCT" in the last 72 hours. ------------------------------------------------------------------------------------------------------------------ Cardiac Enzymes No results for input(s): "CKMB", "TROPONINI", "MYOGLOBIN" in the last 168 hours.  Invalid input(s): "CK"  Micro Results Recent Results (from the past 240 hours)  Blood culture (routine x 2)     Status: None (Preliminary result)   Collection Time: 06/01/23  5:49 PM   Specimen: BLOOD  Result Value Ref Range Status   Specimen Description   Final    BLOOD SITE NOT SPECIFIED Performed at [facility], [address], [place], [place] [address]    Special Requests   Final    BOTTLES DRAWN AEROBIC AND ANAEROBIC Blood Culture results may not be optimal due to an inadequate volume of blood received in culture bottles Performed at [facility], [address], [place], [place] [address]    Culture   Final    NO GROWTH < 12 HOURS Performed at [facility], [address], [place], [place] [address]    Report Status PENDING  Incomplete  Blood culture (routine x 2)     Status: None (Preliminary result)   Collection Time: 06/01/23  5:54 PM   Specimen: BLOOD  Result Value Ref Range Status   Specimen Description   Final    BLOOD BLOOD RIGHT  ARM Performed at [facility], [address] [place], [place] [address]    Special Requests   Final    BOTTLES DRAWN AEROBIC AND ANAEROBIC Blood Culture results may not be optimal due to an inadequate volume of blood received in culture bottles Performed at [facility], [address], [place], [place] [address]    Culture   Final    NO GROWTH < 12 HOURS Performed at [facility], [address], [place], [place] [address]    Report Status PENDING  Incomplete  Radiology Reports CT ABDOMEN PELVIS W CONTRAST Result Date: 06/01/2023 CLINICAL DATA:  Left lower quadrant pain, nausea, and vomiting for four days. EXAM: CT ABDOMEN AND PELVIS WITH CONTRAST TECHNIQUE: Multidetector CT imaging of the abdomen and pelvis was performed using the standard protocol following bolus administration of intravenous contrast. RADIATION DOSE REDUCTION: This exam was performed according to the departmental dose-optimization program which includes automated exposure control, adjustment of the mA and/or kV according to patient size and/or use of iterative reconstruction technique. CONTRAST:  80mL OMNIPAQUE IOHEXOL 300 MG/ML  SOLN COMPARISON:  None Available. FINDINGS: Lower Chest: No acute findings. Hepatobiliary: No suspicious hepatic masses identified. Gallbladder is unremarkable. No evidence of biliary ductal dilatation. Pancreas:  No mass or inflammatory changes. Spleen: Within normal limits in size. Two sub-centimeter low-attenuation lesions are seen in the dome of the spleen which are too small to characterize, but likely represent benign hemangiomas Adrenals/Urinary Tract: No suspicious masses identified. No evidence of ureteral calculi or hydronephrosis. Stomach/Bowel: Moderate hiatal hernia. No evidence of bowel obstruction. Mild wall thickening and pneumatosis is seen involving small bowel in the left lower quadrant, with extraluminal gas tracking into the adjacent  left abdominal mesentery. This is consistent with enteritis no No evidence of free intraperitoneal air or abscess. Anterior abdominal wall surgical mesh seen, without recurrent hernia. Vascular/Lymphatic: No pathologically enlarged lymph nodes. No acute vascular findings. Reproductive:  No mass or other significant abnormality. Other:  None. Musculoskeletal:  No suspicious bone lesions identified. IMPRESSION: Small bowel wall thickening in the left lower quadrant, with pneumatosis and extraluminal gas in the adjacent mesentery, likely due to acute enteritis with micro perforation. No evidence of abscess or bowel obstruction. Moderate hiatal hernia. Electronically Signed   By: Danae Orleans M.D.   On: 06/01/2023 17:32      Signature  -   Steffanie Rainwater M.D on 06/02/2023 at 9:45 AM   -  To page go to [email or web address]

## 2023-06-02 NOTE — Consult Note (Signed)
CC: abd pain  HPI: Adrienne Marsh is an 68 y.o. female who is here for left sided abd pain with nausea and vomiting that has been going on for ~4 days.  Has been on antibiotics for dental infections and taking ibuprofen.  Having episodic diarrhea.    Past Medical History:  Diagnosis Date   Diverticulitis    Essential hypertension    Hernia cerebri (HCC)    Supraventricular tachycardia, paroxysmal (HCC) 02/09/2014   Presented with near syncope broke with adenosine in the ER -    Uterine prolapse     Past Surgical History:  Procedure Laterality Date   ABDOMINAL HYSTERECTOMY     ABDOMINAL SURGERY     COLON RESECTION     uterine prolapse repair      Family History  Problem Relation Age of Onset   Cancer Mother    Cancer Father     Social:  reports that she has quit smoking. Her smoking use included cigarettes. She has a 10 pack-year smoking history. She has never used smokeless tobacco. She reports current alcohol use. She reports that she does not use drugs.  Allergies:  Allergies  Allergen Reactions   Codeine Other (See Comments)    "Makes me feel funny" and dizziness    Medications: I have reviewed the patient's current medications.  Results for orders placed or performed during the hospital encounter of 06/01/23 (from the past 48 hours)  Lipase, blood     Status: None   Collection Time: 06/01/23  3:01 PM  Result Value Ref Range   Lipase 27 11 - 51 U/L    Comment: Performed at [facility], [address] [place], [place] [address]  Comprehensive metabolic panel     Status: Abnormal   Collection Time: 06/01/23  3:01 PM  Result Value Ref Range   Sodium 140 135 - 145 mmol/L   Potassium 3.8 3.5 - 5.1 mmol/L   Chloride 111 98 - 111 mmol/L   CO2 19 (L) 22 - 32 mmol/L   Glucose, Bld 137 (H) 70 - 99 mg/dL    Comment: Glucose reference range applies only to samples taken after fasting for at least 8 hours.   BUN 37 (H) 8 - 23 mg/dL   Creatinine, Ser  4.69 (H) 0.44 - 1.00 mg/dL   Calcium 8.6 (L) 8.9 - 10.3 mg/dL   Total Protein 6.9 6.5 - 8.1 g/dL   Albumin 3.4 (L) 3.5 - 5.0 g/dL   AST 68 (H) 15 - 41 U/L   ALT 45 (H) 0 - 44 U/L   Alkaline Phosphatase 139 (H) 38 - 126 U/L   Total Bilirubin 1.3 (H) 0.0 - 1.2 mg/dL   GFR, Estimated 46 (L) >60 mL/min    Comment: (NOTE) Calculated using the CKD-EPI Creatinine Equation (2021)    Anion gap 10 5 - 15    Comment: Performed at [facility], [address], [place], [place] [address]  CBC     Status: Abnormal   Collection Time: 06/01/23  3:01 PM  Result Value Ref Range   WBC 12.9 (H) 4.0 - 10.5 K/uL   RBC 3.61 (L) 3.87 - 5.11 MIL/uL   Hemoglobin 10.6 (L) 12.0 - 15.0 g/dL   HCT 84.1 (L) 32.4 - 40.1 %   MCV 94.2 80.0 - 100.0 fL   MCH 29.4 26.0 - 34.0 pg   MCHC 31.2 30.0 - 36.0 g/dL   RDW 02.7 25.3 - 66.4 %  Platelets 299 150 - 400 K/uL   nRBC 0.0 0.0 - 0.2 %    Comment: Performed at [facility], [address], [place], [place] [address]  Urinalysis, Routine w reflex microscopic -Urine, Clean Catch     Status: Abnormal   Collection Time: 06/01/23  3:54 PM  Result Value Ref Range   Color, Urine AMBER (A) YELLOW    Comment: BIOCHEMICALS MAY BE AFFECTED BY COLOR   APPearance CLOUDY (A) CLEAR   Specific Gravity, Urine 1.030 1.005 - 1.030   pH 5.0 5.0 - 8.0   Glucose, UA NEGATIVE NEGATIVE mg/dL   Hgb urine dipstick NEGATIVE NEGATIVE   Bilirubin Urine SMALL (A) NEGATIVE   Ketones, ur NEGATIVE NEGATIVE mg/dL   Protein, ur 725 (A) NEGATIVE mg/dL   Nitrite NEGATIVE NEGATIVE   Leukocytes,Ua TRACE (A) NEGATIVE   RBC / HPF 0-5 0 - 5 RBC/hpf   WBC, UA 11-20 0 - 5 WBC/hpf   Bacteria, UA RARE (A) NONE SEEN   Squamous Epithelial / HPF 0-5 0 - 5 /HPF   WBC Clumps PRESENT    Mucus PRESENT    Hyaline Casts, UA PRESENT    Granular Casts, UA PRESENT    Non Squamous Epithelial 0-5 (A) NONE SEEN    Comment: Performed at [facility], [address] [place], [place] [address]  Lactic acid, plasma     Status: None   Collection Time: 06/01/23  7:25 PM  Result Value Ref Range   Lactic Acid, Venous 0.7 0.5 - 1.9 mmol/L    Comment: Performed at [facility], [address] [place], [place] [address]  CBC     Status: Abnormal   Collection Time: 06/02/23  5:14 AM  Result Value Ref Range   WBC 10.1 4.0 - 10.5 K/uL   RBC 3.30 (L) 3.87 - 5.11 MIL/uL   Hemoglobin 9.8 (L) 12.0 - 15.0 g/dL   HCT 25.9 (L) 56.3 - 87.5 %   MCV 94.2 80.0 - 100.0 fL   MCH 29.7 26.0 - 34.0 pg   MCHC 31.5 30.0 - 36.0 g/dL   RDW 64.3 32.9 - 51.8 %   Platelets 225 150 - 400 K/uL   nRBC 0.0 0.0 - 0.2 %    Comment: Performed at [facility], [address], [place], [place] [address]  Basic metabolic panel     Status: Abnormal   Collection Time: 06/02/23  5:14 AM  Result Value Ref Range   Sodium 139 135 - 145 mmol/L   Potassium 3.6 3.5 - 5.1 mmol/L   Chloride 107 98 - 111 mmol/L   CO2 22 22 - 32 mmol/L   Glucose, Bld 105 (H) 70 - 99 mg/dL    Comment: Glucose reference range applies only to samples taken after fasting for at least 8 hours.   BUN 34 (H) 8 - 23 mg/dL   Creatinine, Ser 0.63 0.44 - 1.00 mg/dL   Calcium 8.5 (L) 8.9 - 10.3 mg/dL   GFR, Estimated >01 >60 mL/min    Comment: (NOTE) Calculated using the CKD-EPI Creatinine Equation (2021)    Anion gap 10 5 - 15    Comment: Performed at [facility], [address] [place], [place] [address]  Magnesium     Status: None   Collection Time: 06/02/23  5:14 AM  Result Value Ref Range   Magnesium 2.0 1.7 - 2.4 mg/dL    Comment: Performed at [facility], [address] [place], [place] [address]  Phosphorus     Status: None   Collection Time: 06/02/23  5:14 AM  Result Value Ref Range   Phosphorus 2.6 2.5 - 4.6 mg/dL    Comment: Performed at [facility], [address] [place], [place] [address]     CT ABDOMEN PELVIS W CONTRAST Result Date: 06/01/2023 CLINICAL DATA:  Left lower quadrant pain, nausea, and vomiting for four days. EXAM: CT ABDOMEN AND PELVIS WITH CONTRAST TECHNIQUE: Multidetector CT imaging of the abdomen and pelvis was performed using the standard protocol following bolus administration of intravenous contrast. RADIATION DOSE REDUCTION: This exam was performed according to the departmental dose-optimization program which includes automated exposure control, adjustment of the mA and/or kV according to patient size and/or use of iterative reconstruction technique. CONTRAST:  80mL OMNIPAQUE IOHEXOL 300 MG/ML  SOLN COMPARISON:  None Available. FINDINGS: Lower Chest: No acute findings. Hepatobiliary: No suspicious hepatic masses identified. Gallbladder is unremarkable. No evidence of biliary ductal dilatation. Pancreas:  No mass or inflammatory changes. Spleen: Within normal limits in size. Two sub-centimeter low-attenuation lesions are seen in the dome of the spleen which are too small to characterize, but likely represent benign hemangiomas Adrenals/Urinary Tract: No suspicious masses identified. No evidence of ureteral calculi or hydronephrosis. Stomach/Bowel: Moderate hiatal hernia. No evidence of bowel obstruction. Mild wall thickening and pneumatosis is seen involving small bowel in the left lower quadrant, with extraluminal gas tracking into the adjacent left abdominal mesentery. This is consistent with enteritis no No evidence of free intraperitoneal air or abscess. Anterior abdominal wall surgical mesh seen, without recurrent hernia. Vascular/Lymphatic: No pathologically enlarged lymph nodes. No acute vascular findings. Reproductive:  No mass or other significant abnormality. Other:  None. Musculoskeletal:  No suspicious bone lesions identified. IMPRESSION: Small bowel wall thickening in the left lower quadrant, with pneumatosis and extraluminal gas in the adjacent mesentery, likely  due to acute enteritis with micro perforation. No evidence of abscess or bowel obstruction. Moderate hiatal hernia. Electronically Signed   By: Danae Orleans M.D.   On: 06/01/2023 17:32    ROS - all of the below systems have been reviewed with the patient and positives are indicated with bold text General: chills, fever or night sweats Eyes: blurry vision or double vision ENT: epistaxis or sore throat Allergy/Immunology: itchy/watery eyes or nasal congestion Hematologic/Lymphatic: bleeding problems, blood clots or swollen lymph nodes Endocrine: temperature intolerance or unexpected weight changes Resp: cough, shortness of breath, or wheezing CV: chest pain or dyspnea on exertion GI: as per HPI GU: dysuria, trouble voiding, or hematuria MSK: joint pain or joint stiffness Neuro: TIA or stroke symptoms Derm: pruritus and skin lesion changes Psych: anxiety and depression  PE Blood pressure (!) 135/55, pulse 82, temperature 98.6 F (37 C), temperature source Oral, resp. rate 18, height 5\' 11"  (1.803 m), weight 81.6 kg, SpO2 95%. Constitutional: NAD; conversant; no deformities Eyes: Moist conjunctiva; no lid lag; anicteric; PERRL Neck: Trachea midline; no thyromegaly Lungs: Normal respiratory effort; no tactile fremitus CV: RRR; no palpable thrills; no pitting edema GI: Abd soft, mild TTP LLQ; no palpable hepatosplenomegaly MSK: Normal range of motion of extremities; no clubbing/cyanosis Psychiatric: Appropriate affect; alert and oriented x3 Lymphatic: No palpable cervical or axillary lymphadenopathy  Results for orders placed or performed during the hospital encounter of 06/01/23 (from the past 48 hours)  Lipase, blood     Status: None   Collection Time: 06/01/23  3:01 PM  Result Value  Ref Range   Lipase 27 11 - 51 U/L    Comment: Performed at [facility], [address], [place], [place] [address]  Comprehensive metabolic panel     Status: Abnormal   Collection  Time: 06/01/23  3:01 PM  Result Value Ref Range   Sodium 140 135 - 145 mmol/L   Potassium 3.8 3.5 - 5.1 mmol/L   Chloride 111 98 - 111 mmol/L   CO2 19 (L) 22 - 32 mmol/L   Glucose, Bld 137 (H) 70 - 99 mg/dL    Comment: Glucose reference range applies only to samples taken after fasting for at least 8 hours.   BUN 37 (H) 8 - 23 mg/dL   Creatinine, Ser 6.04 (H) 0.44 - 1.00 mg/dL   Calcium 8.6 (L) 8.9 - 10.3 mg/dL   Total Protein 6.9 6.5 - 8.1 g/dL   Albumin 3.4 (L) 3.5 - 5.0 g/dL   AST 68 (H) 15 - 41 U/L   ALT 45 (H) 0 - 44 U/L   Alkaline Phosphatase 139 (H) 38 - 126 U/L   Total Bilirubin 1.3 (H) 0.0 - 1.2 mg/dL   GFR, Estimated 46 (L) >60 mL/min    Comment: (NOTE) Calculated using the CKD-EPI Creatinine Equation (2021)    Anion gap 10 5 - 15    Comment: Performed at [facility], [address] [place], [place] [address]  CBC     Status: Abnormal   Collection Time: 06/01/23  3:01 PM  Result Value Ref Range   WBC 12.9 (H) 4.0 - 10.5 K/uL   RBC 3.61 (L) 3.87 - 5.11 MIL/uL   Hemoglobin 10.6 (L) 12.0 - 15.0 g/dL   HCT 11.9 (L) 14.7 - 82.9 %   MCV 94.2 80.0 - 100.0 fL   MCH 29.4 26.0 - 34.0 pg   MCHC 31.2 30.0 - 36.0 g/dL   RDW 56.2 13.0 - 86.5 %   Platelets 299 150 - 400 K/uL   nRBC 0.0 0.0 - 0.2 %    Comment: Performed at [facility], [address] [place], [place] [address]  Urinalysis, Routine w reflex microscopic -Urine, Clean Catch     Status: Abnormal   Collection Time: 06/01/23  3:54 PM  Result Value Ref Range   Color, Urine AMBER (A) YELLOW    Comment: BIOCHEMICALS MAY BE AFFECTED BY COLOR   APPearance CLOUDY (A) CLEAR   Specific Gravity, Urine 1.030 1.005 - 1.030   pH 5.0 5.0 - 8.0   Glucose, UA NEGATIVE NEGATIVE mg/dL   Hgb urine dipstick NEGATIVE NEGATIVE   Bilirubin Urine SMALL (A) NEGATIVE   Ketones, ur NEGATIVE NEGATIVE mg/dL   Protein, ur 629 (A) NEGATIVE mg/dL   Nitrite NEGATIVE NEGATIVE   Leukocytes,Ua TRACE (A)  NEGATIVE   RBC / HPF 0-5 0 - 5 RBC/hpf   WBC, UA 11-20 0 - 5 WBC/hpf   Bacteria, UA RARE (A) NONE SEEN   Squamous Epithelial / HPF 0-5 0 - 5 /HPF   WBC Clumps PRESENT    Mucus PRESENT    Hyaline Casts, UA PRESENT    Granular Casts, UA PRESENT    Non Squamous Epithelial 0-5 (A) NONE SEEN    Comment: Performed at [facility], [address], [place], [place] [address]  Lactic acid, plasma     Status: None   Collection Time: 06/01/23  7:25 PM  Result Value Ref Range   Lactic Acid, Venous 0.7 0.5 -  1.9 mmol/L    Comment: Performed at [facility], [address], [place], [place] [address]  CBC     Status: Abnormal   Collection Time: 06/02/23  5:14 AM  Result Value Ref Range   WBC 10.1 4.0 - 10.5 K/uL   RBC 3.30 (L) 3.87 - 5.11 MIL/uL   Hemoglobin 9.8 (L) 12.0 - 15.0 g/dL   HCT 60.4 (L) 54.0 - 98.1 %   MCV 94.2 80.0 - 100.0 fL   MCH 29.7 26.0 - 34.0 pg   MCHC 31.5 30.0 - 36.0 g/dL   RDW 19.1 47.8 - 29.5 %   Platelets 225 150 - 400 K/uL   nRBC 0.0 0.0 - 0.2 %    Comment: Performed at [facility], [address], [place], [place] [address]  Basic metabolic panel     Status: Abnormal   Collection Time: 06/02/23  5:14 AM  Result Value Ref Range   Sodium 139 135 - 145 mmol/L   Potassium 3.6 3.5 - 5.1 mmol/L   Chloride 107 98 - 111 mmol/L   CO2 22 22 - 32 mmol/L   Glucose, Bld 105 (H) 70 - 99 mg/dL    Comment: Glucose reference range applies only to samples taken after fasting for at least 8 hours.   BUN 34 (H) 8 - 23 mg/dL   Creatinine, Ser 8.65 0.44 - 1.00 mg/dL   Calcium 8.5 (L) 8.9 - 10.3 mg/dL   GFR, Estimated >78 >46 mL/min    Comment: (NOTE) Calculated using the CKD-EPI Creatinine Equation (2021)    Anion gap 10 5 - 15    Comment: Performed at [facility], [address], [place], [place] [address]  Magnesium     Status: None   Collection Time: 06/02/23  5:14 AM  Result Value Ref Range    Magnesium 2.0 1.7 - 2.4 mg/dL    Comment: Performed at [facility] [address] [place], [place] [address]  Phosphorus     Status: None   Collection Time: 06/02/23  5:14 AM  Result Value Ref Range   Phosphorus 2.6 2.5 - 4.6 mg/dL    Comment: Performed at [facility], [address], [place], [place] [address]    CT ABDOMEN PELVIS W CONTRAST Result Date: 06/01/2023 CLINICAL DATA:  Left lower quadrant pain, nausea, and vomiting for four days. EXAM: CT ABDOMEN AND PELVIS WITH CONTRAST TECHNIQUE: Multidetector CT imaging of the abdomen and pelvis was performed using the standard protocol following bolus administration of intravenous contrast. RADIATION DOSE REDUCTION: This exam was performed according to the departmental dose-optimization program which includes automated exposure control, adjustment of the mA and/or kV according to patient size and/or use of iterative reconstruction technique. CONTRAST:  80mL OMNIPAQUE IOHEXOL 300 MG/ML  SOLN COMPARISON:  None Available. FINDINGS: Lower Chest: No acute findings. Hepatobiliary: No suspicious hepatic masses identified. Gallbladder is unremarkable. No evidence of biliary ductal dilatation. Pancreas:  No mass or inflammatory changes. Spleen: Within normal limits in size. Two sub-centimeter low-attenuation lesions are seen in the dome of the spleen which are too small to characterize, but likely represent benign hemangiomas Adrenals/Urinary Tract: No suspicious masses identified. No evidence of ureteral calculi or hydronephrosis. Stomach/Bowel: Moderate hiatal hernia. No evidence of bowel obstruction. Mild wall thickening and pneumatosis is seen involving small bowel in the left lower quadrant, with extraluminal gas tracking into the adjacent left abdominal mesentery. This is consistent with enteritis no No evidence  of free intraperitoneal air or abscess. Anterior abdominal wall surgical mesh seen, without recurrent  hernia. Vascular/Lymphatic: No pathologically enlarged lymph nodes. No acute vascular findings. Reproductive:  No mass or other significant abnormality. Other:  None. Musculoskeletal:  No suspicious bone lesions identified. IMPRESSION: Small bowel wall thickening in the left lower quadrant, with pneumatosis and extraluminal gas in the adjacent mesentery, likely due to acute enteritis with micro perforation. No evidence of abscess or bowel obstruction. Moderate hiatal hernia. Electronically Signed   By: Danae Orleans M.D.   On: 06/01/2023 17:32    I have personally reviewed the relevant CT images and lab work     A/P: Adrienne Marsh is an 68 y.o. female with LLQ pain and enteritis with no signs of perforation (just microperforation).   Would treat the same as enteritis.  No need for surgical resection.  Will sign off.  Please call with questions or concerns  I spent a total of 63 minutes in both face-to-face and non-face-to-face activities, excluding procedures performed, for this visit on the date of this encounter.  Moderate MDM  Vanita Panda, MD  Colorectal and General Surgery [facility]

## 2023-06-03 DIAGNOSIS — K529 Noninfective gastroenteritis and colitis, unspecified: Secondary | ICD-10-CM

## 2023-06-03 LAB — CBC
HCT: 30.3 % — ABNORMAL LOW (ref 36.0–46.0)
Hemoglobin: 9.4 g/dL — ABNORMAL LOW (ref 12.0–15.0)
MCH: 28.8 pg (ref 26.0–34.0)
MCHC: 31 g/dL (ref 30.0–36.0)
MCV: 92.9 fL (ref 80.0–100.0)
Platelets: 257 10*3/uL (ref 150–400)
RBC: 3.26 MIL/uL — ABNORMAL LOW (ref 3.87–5.11)
RDW: 13.7 % (ref 11.5–15.5)
WBC: 7.9 10*3/uL (ref 4.0–10.5)
nRBC: 0 % (ref 0.0–0.2)

## 2023-06-03 LAB — BASIC METABOLIC PANEL
Anion gap: 8 (ref 5–15)
BUN: 27 mg/dL — ABNORMAL HIGH (ref 8–23)
CO2: 20 mmol/L — ABNORMAL LOW (ref 22–32)
Calcium: 8.2 mg/dL — ABNORMAL LOW (ref 8.9–10.3)
Chloride: 110 mmol/L (ref 98–111)
Creatinine, Ser: 0.9 mg/dL (ref 0.44–1.00)
GFR, Estimated: >60 mL/min (ref >60)
Glucose, Bld: 90 mg/dL (ref 70–99)
Potassium: 3.6 mmol/L (ref 3.5–5.1)
Sodium: 138 mmol/L (ref 135–145)

## 2023-06-03 MED ORDER — METOPROLOL TARTRATE 12.5 MG HALF TABLET: 12.5000 mg | ORAL_TABLET | Freq: Once | ORAL | Status: DC | PRN

## 2023-06-03 NOTE — Progress Notes (Signed)
PROGRESS NOTE                                                                                                                                                                                                             Patient Demographics:    Adrienne Marsh, is a 68 y.o. female, DOB - 1955-10-19, ZOX:[id number]  Outpatient Primary MD for the patient is [name], NP    LOS - 1  Admit date - 06/01/2023    Chief Complaint  Patient presents with   Abdominal Pain   Emesis       Brief Narrative (HPI from H&P)   Adrienne Marsh is a 68 y.o. female with medical history significant for PTSD, anxiety and depression, panic attacks, HLD, HTN, paroxysmal SVT, hiatal hernia, urge incontinence, diverticulitis s/p colon resection and gastritis who presented to the ED for evaluation of abdominal pain, nausea and vomiting.  CT A/P on admission showed evidence of acute cystitis with microperforation but no evidence of abscess or bowel obstruction.  General surgery was consulted and patient was started on IV Zosyn. They have now signed off and have recommended only treatment as for enteritis.   The patient continues to state that her pain has not imrproved and that she contines to feel ill and weak. She states that she is having a very hard time eating. She has remained afebrile.   Subjective:    Adrienne Marsh today was evaluated laying comfortably in bed.  She states that at best her left lower quadrant pain is only minimally improved. She states that she is only able to eat a little.   Assessment  & Plan :    Assessment and Plan:  Acute enteritis with microperforation She continues to have LLQ pain. Tolerating p.o. intake without nausea or vomiting this morning. Leukocytosis has resolved, she remains afebrile.  No surgical intervention per general surgery, signed off today. The recommendation is for treatment as for entritis.  -Continue IV  Zosyn -IV Dilaudid as needed for pain -Blood cultures no growth after 48 hours -Anticipate discharge home when pain and ability to take regular PO have improved.   AKI, resolved -Creatinine has returned to baseline. Will stop IV fluids.   HTN BP improved with SBP in the 130s -Continue Lopressor -Resume home lisinopril   HLD -Continue atorvastatin   PTSD  Anxiety and depression -Continue sertraline, prazosin and Ativan -Pt is quite anxious about the prospect of being discharged.   GERD -Continue Protonix.  Hiatal hernia Noted. Stable.  Gastritis -Continue Protonix and as needed Pepcid   Urge incontinence -Continue Myrbetriq  Obesity: Estimated body mass index is 25.09 kg/m as calculated from the following:   Height as of this encounter: 5\' 11"  (1.803 m).   Weight as of this encounter: 81.6 kg.          Condition -stable  Family Communication  : No family at bedside  Code Status : Full code  Consults  : General Surgery, signed off on 1/25  PUD Prophylaxis : Protonix   Procedures  :     None      Disposition Plan  :    Status is: Observation The patient will require care spanning > 2 midnights and should be moved to inpatient because: Ongoing abdominal pain and IV antibiotics for enteritis.  DVT Prophylaxis  :    enoxaparin (LOVENOX) injection 40 mg Start: 06/01/23 2200   Lab Results  Component Value Date   PLT 257 06/03/2023    Diet :  Diet Order             Diet regular Room service appropriate? Yes; Fluid consistency: Thin  Diet effective now                    Inpatient Medications  Scheduled Meds:  atorvastatin  20 mg Oral Daily   enoxaparin (LOVENOX) injection  40 mg Subcutaneous Q24H   lisinopril  10 mg Oral Daily   melatonin  5 mg Oral Once   metoprolol tartrate  12.5-25 mg Oral Daily   mirabegron ER  25 mg Oral QHS   multivitamin with minerals  1 tablet Oral Q breakfast   pantoprazole  40 mg Oral QHS   prazosin  1  mg Oral QHS   sertraline  200 mg Oral Daily   Continuous Infusions:  piperacillin-tazobactam (ZOSYN)  IV 3.375 g (06/03/23 1130)   PRN Meds:.acetaminophen **OR** acetaminophen, famotidine, HYDROmorphone (DILAUDID) injection, LORazepam, ondansetron **OR** ondansetron (ZOFRAN) IV, senna-docusate  Antibiotics  :    Anti-infectives (From admission, onward)    Start     Dose/Rate Route Frequency Ordered Stop   06/02/23 0400  piperacillin-tazobactam (ZOSYN) IVPB 3.375 g        3.375 g 12.5 mL/hr over 240 Minutes Intravenous Every 8 hours 06/02/23 0000     06/01/23 1800  piperacillin-tazobactam (ZOSYN) IVPB 3.375 g        3.375 g 100 mL/hr over 30 Minutes Intravenous  Once 06/01/23 1749 06/01/23 2025         Objective:   Vitals:   06/02/23 2334 06/03/23 0409 06/03/23 0740 06/03/23 1306  BP:  (!) 120/55 125/61 128/65  Pulse: 72 63 72 78  Resp:  18 16 18   Temp:  98.6 F (37 C) 98.3 F (36.8 C) 98 F (36.7 C)  TempSrc:  Oral Oral   SpO2:  96% 98% 97%  Weight:      Height:        Wt Readings from Last 3 Encounters:  06/01/23 81.6 kg  02/04/22 81.6 kg  08/21/18 86.2 kg     Intake/Output Summary (Last 24 hours) at 06/03/2023 1646 Last data filed at 06/03/2023 1610 Gross per 24 hour  Intake 890 ml  Output 850 ml  Net 40 ml   Exam:  Constitutional:  The patient is  awake, alert, and oriented x 3. Mild distress from discomfort. Respiratory:  No increased work of breathing. No wheezes, rales, or rhonchi No tactile fremitus Cardiovascular:  Regular rate and rhythm No murmurs, ectopy, or gallups. No lateral PMI. No thrills. Abdomen:  Abdomen is soft, non-distended Positive for left lower quadrant tenderness No hernias, masses, or organomegaly Hypoactive bowel sounds.  Musculoskeletal:  No cyanosis, clubbing, or edema Skin:  No rashes, lesions, ulcers palpation of skin: no induration or nodules Neurologic:  CN 2-12 intact Sensation all 4 extremities  intact Psychiatric:  Mental status Mood, affect appropriate Orientation to person, place, time  judgment and insight appear intact    Data Review:    Recent Labs  Lab 06/01/23 1501 06/02/23 0514 06/03/23 0510  WBC 12.9* 10.1 7.9  HGB 10.6* 9.8* 9.4*  HCT 34.0* 31.1* 30.3*  PLT 299 225 257  MCV 94.2 94.2 92.9  MCH 29.4 29.7 28.8  MCHC 31.2 31.5 31.0  RDW 13.5 13.7 13.7    Recent Labs  Lab 06/01/23 1501 06/01/23 1925 06/02/23 0514 06/03/23 0727  NA 140  --  139 138  K 3.8  --  3.6 3.6  CL 111  --  107 110  CO2 19*  --  22 20*  ANIONGAP 10  --  10 8  GLUCOSE 137*  --  105* 90  BUN 37*  --  34* 27*  CREATININE 1.28*  --  0.97 0.90  AST 68*  --   --   --   ALT 45*  --   --   --   ALKPHOS 139*  --   --   --   BILITOT 1.3*  --   --   --   ALBUMIN 3.4*  --   --   --   LATICACIDVEN  --  0.7  --   --   MG  --   --  2.0  --   CALCIUM 8.6*  --  8.5* 8.2*      Recent Labs  Lab 06/01/23 1501 06/01/23 1925 06/02/23 0514 06/03/23 0727  LATICACIDVEN  --  0.7  --   --   MG  --   --  2.0  --   CALCIUM 8.6*  --  8.5* 8.2*    --------------------------------------------------------------------------------------------------------------- No results found for: "CHOL", "HDL", "LDLCALC", "LDLDIRECT", "TRIG", "CHOLHDL"  No results found for: "HGBA1C" No results for input(s): "TSH", "T4TOTAL", "FREET4", "T3FREE", "THYROIDAB" in the last 72 hours. No results for input(s): "VITAMINB12", "FOLATE", "FERRITIN", "TIBC", "IRON", "RETICCTPCT" in the last 72 hours. ------------------------------------------------------------------------------------------------------------------ Cardiac Enzymes No results for input(s): "CKMB", "TROPONINI", "MYOGLOBIN" in the last 168 hours.  Invalid input(s): "CK"  Micro Results Recent Results (from the past 240 hours)  Blood culture (routine x 2)     Status: None (Preliminary result)   Collection Time: 06/01/23  5:49 PM   Specimen: BLOOD   Result Value Ref Range Status   Specimen Description   Final    BLOOD SITE NOT SPECIFIED Performed at [facility], [address] [place], [place] [address]    Special Requests   Final    BOTTLES DRAWN AEROBIC AND ANAEROBIC Blood Culture results may not be optimal due to an inadequate volume of blood received in culture bottles Performed at [facility], [address], [place], [place] [address]    Culture   Final    NO GROWTH 2 DAYS Performed at [facility], [address] [place], [place]  [address]    Report Status PENDING  Incomplete  Blood culture (routine x 2)     Status: None (Preliminary result)   Collection Time: 06/01/23  5:54 PM   Specimen: BLOOD  Result Value Ref Range Status   Specimen Description   Final    BLOOD BLOOD RIGHT ARM Performed at [facility], [address], [place], [place] [address]    Special Requests   Final    BOTTLES DRAWN AEROBIC AND ANAEROBIC Blood Culture results may not be optimal due to an inadequate volume of blood received in culture bottles Performed at [facility], [address] [place], [place] [address]    Culture   Final    NO GROWTH 2 DAYS Performed at [facility], [address], [place], [place] [address]    Report Status PENDING  Incomplete    Radiology Reports CT ABDOMEN PELVIS W CONTRAST Result Date: 06/01/2023 CLINICAL DATA:  Left lower quadrant pain, nausea, and vomiting for four days. EXAM: CT ABDOMEN AND PELVIS WITH CONTRAST TECHNIQUE: Multidetector CT imaging of the abdomen and pelvis was performed using the standard protocol following bolus administration of intravenous contrast. RADIATION DOSE REDUCTION: This exam was performed according to the departmental dose-optimization program which includes automated exposure control, adjustment of the mA and/or kV according to patient size and/or use of iterative reconstruction  technique. CONTRAST:  80mL OMNIPAQUE IOHEXOL 300 MG/ML  SOLN COMPARISON:  None Available. FINDINGS: Lower Chest: No acute findings. Hepatobiliary: No suspicious hepatic masses identified. Gallbladder is unremarkable. No evidence of biliary ductal dilatation. Pancreas:  No mass or inflammatory changes. Spleen: Within normal limits in size. Two sub-centimeter low-attenuation lesions are seen in the dome of the spleen which are too small to characterize, but likely represent benign hemangiomas Adrenals/Urinary Tract: No suspicious masses identified. No evidence of ureteral calculi or hydronephrosis. Stomach/Bowel: Moderate hiatal hernia. No evidence of bowel obstruction. Mild wall thickening and pneumatosis is seen involving small bowel in the left lower quadrant, with extraluminal gas tracking into the adjacent left abdominal mesentery. This is consistent with enteritis no No evidence of free intraperitoneal air or abscess. Anterior abdominal wall surgical mesh seen, without recurrent hernia. Vascular/Lymphatic: No pathologically enlarged lymph nodes. No acute vascular findings. Reproductive:  No mass or other significant abnormality. Other:  None. Musculoskeletal:  No suspicious bone lesions identified. IMPRESSION: Small bowel wall thickening in the left lower quadrant, with pneumatosis and extraluminal gas in the adjacent mesentery, likely due to acute enteritis with micro perforation. No evidence of abscess or bowel obstruction. Moderate hiatal hernia. Electronically Signed   By: Danae Orleans M.D.   On: 06/01/2023 17:32      Signature  -   Fran Lowes M.D on 06/03/2023 at 4:46 PM   -  To page go to [email or web address]

## 2023-06-04 ENCOUNTER — Other Ambulatory Visit: Payer: Self-pay

## 2023-06-04 DIAGNOSIS — K529 Noninfective gastroenteritis and colitis, unspecified: Secondary | ICD-10-CM | POA: Diagnosis not present

## 2023-06-04 LAB — CBC WITH DIFFERENTIAL/PLATELET
Abs Immature Granulocytes: 0.09 10*3/uL — ABNORMAL HIGH (ref 0.00–0.07)
Basophils Absolute: 0 10*3/uL (ref 0.0–0.1)
Basophils Relative: 1 %
Eosinophils Absolute: 0.3 10*3/uL (ref 0.0–0.5)
Eosinophils Relative: 4 %
HCT: 30 % — ABNORMAL LOW (ref 36.0–46.0)
Hemoglobin: 9.3 g/dL — ABNORMAL LOW (ref 12.0–15.0)
Immature Granulocytes: 1 %
Lymphocytes Relative: 14 %
Lymphs Abs: 1.1 10*3/uL (ref 0.7–4.0)
MCH: 29 pg (ref 26.0–34.0)
MCHC: 31 g/dL (ref 30.0–36.0)
MCV: 93.5 fL (ref 80.0–100.0)
Monocytes Absolute: 1 10*3/uL (ref 0.1–1.0)
Monocytes Relative: 13 %
Neutro Abs: 5 10*3/uL (ref 1.7–7.7)
Neutrophils Relative %: 67 %
Platelets: 253 10*3/uL (ref 150–400)
RBC: 3.21 MIL/uL — ABNORMAL LOW (ref 3.87–5.11)
RDW: 13.4 % (ref 11.5–15.5)
WBC: 7.5 10*3/uL (ref 4.0–10.5)
nRBC: 0 % (ref 0.0–0.2)

## 2023-06-04 LAB — BASIC METABOLIC PANEL
Anion gap: 8 (ref 5–15)
BUN: 20 mg/dL (ref 8–23)
CO2: 23 mmol/L (ref 22–32)
Calcium: 8.2 mg/dL — ABNORMAL LOW (ref 8.9–10.3)
Chloride: 107 mmol/L (ref 98–111)
Creatinine, Ser: 0.95 mg/dL (ref 0.44–1.00)
GFR, Estimated: >60 mL/min (ref >60)
Glucose, Bld: 99 mg/dL (ref 70–99)
Potassium: 3.8 mmol/L (ref 3.5–5.1)
Sodium: 138 mmol/L (ref 135–145)

## 2023-06-04 NOTE — Progress Notes (Signed)
PROGRESS NOTE                                                                                                                                                                                                             Patient Demographics:    Adrienne Marsh, is a 68 y.o. female, DOB - [birth year], ONG:[id number]  Outpatient Primary MD for the patient is [name], NP    LOS - 2  Admit date - 06/01/2023    Chief Complaint  Patient presents with   Abdominal Pain   Emesis       Brief Narrative (HPI from H&P)   Adrienne Marsh is a 68 y.o. female with medical history significant for PTSD, anxiety and depression, panic attacks, HLD, HTN, paroxysmal SVT, hiatal hernia, urge incontinence, diverticulitis s/p colon resection and gastritis who presented to the ED for evaluation of abdominal pain, nausea and vomiting.  CT A/P on admission showed evidence of acute cystitis with microperforation but no evidence of abscess or bowel obstruction.  General surgery was consulted and patient was started on IV Zosyn. They have now signed off and have recommended only treatment as for enteritis.   The patient continues to state that her pain has not imrproved and that she contines to feel ill and weak. She states that she is having a very hard time eating. She has remained afebrile.   Subjective:    Adrienne Marsh today was evaluated laying comfortably in bed.  She states that at best her left lower quadrant pain is only minimally improved. She states that she is only able to eat a little.   Assessment  & Plan :    Assessment and Plan:  Acute enteritis with microperforation She continues to have LLQ pain. Tolerating p.o. intake without nausea or vomiting this morning. Leukocytosis has resolved, she remains afebrile.  No surgical intervention per general surgery, signed off today. The recommendation is for treatment as for entritis.  -Continue IV  Zosyn -IV Dilaudid as needed for pain -Blood cultures no growth after 48 hours -Anticipate discharge home when pain and ability to take regular PO have improved.   AKI, resolved -Creatinine has returned to baseline. Will stop IV fluids.   HTN BP improved with SBP in the 130s -Continue Lopressor -Resume home lisinopril   HLD -Continue atorvastatin   PTSD  Anxiety and depression -Continue sertraline, prazosin and Ativan -Pt is quite anxious about the prospect of being discharged.   GERD -Continue Protonix.  Hiatal hernia Noted. Stable.  Gastritis -Continue Protonix and as needed Pepcid   Urge incontinence -Continue Myrbetriq  Obesity: Estimated body mass index is 25.09 kg/m as calculated from the following:   Height as of this encounter: 5\' 11"  (1.803 m).   Weight as of this encounter: 81.6 kg.          Condition -stable  Family Communication  : No family at bedside  Code Status : Full code  Consults  : General Surgery, signed off on 1/25  PUD Prophylaxis : Protonix   Procedures  :     None      Disposition Plan  :    Status is: Observation The patient will require care spanning > 2 midnights and should be moved to inpatient because: Ongoing abdominal pain and IV antibiotics for enteritis.  DVT Prophylaxis  :    enoxaparin (LOVENOX) injection 40 mg Start: 06/01/23 2200   Lab Results  Component Value Date   PLT 253 06/04/2023    Diet :  Diet Order             Diet regular Room service appropriate? Yes; Fluid consistency: Thin  Diet effective now                    Inpatient Medications  Scheduled Meds:  atorvastatin  20 mg Oral Daily   enoxaparin (LOVENOX) injection  40 mg Subcutaneous Q24H   lisinopril  10 mg Oral Daily   melatonin  5 mg Oral Once   metoprolol tartrate  12.5-25 mg Oral Daily   mirabegron ER  25 mg Oral QHS   multivitamin with minerals  1 tablet Oral Q breakfast   pantoprazole  40 mg Oral QHS   prazosin  1  mg Oral QHS   sertraline  200 mg Oral Daily   Continuous Infusions:  piperacillin-tazobactam (ZOSYN)  IV 3.375 g (06/04/23 1130)   PRN Meds:.acetaminophen **OR** acetaminophen, famotidine, HYDROmorphone (DILAUDID) injection, LORazepam, metoprolol tartrate, ondansetron **OR** ondansetron (ZOFRAN) IV, senna-docusate  Antibiotics  :    Anti-infectives (From admission, onward)    Start     Dose/Rate Route Frequency Ordered Stop   06/02/23 0400  piperacillin-tazobactam (ZOSYN) IVPB 3.375 g        3.375 g 12.5 mL/hr over 240 Minutes Intravenous Every 8 hours 06/02/23 0000     06/01/23 1800  piperacillin-tazobactam (ZOSYN) IVPB 3.375 g        3.375 g 100 mL/hr over 30 Minutes Intravenous  Once 06/01/23 1749 06/01/23 2025         Objective:   Vitals:   06/03/23 1652 06/03/23 2117 06/04/23 0537 06/04/23 1407  BP: 139/64 139/68 132/65 (!) 147/72  Pulse: 78 80 77 80  Resp: 18 16 15 17   Temp: 98.1 F (36.7 C) 98.2 F (36.8 C) 98 F (36.7 C) 98.7 F (37.1 C)  TempSrc:  Oral Oral Oral  SpO2: 97% 95% 97% 97%  Weight:      Height:        Wt Readings from Last 3 Encounters:  06/01/23 81.6 kg  02/04/22 81.6 kg  08/21/18 86.2 kg     Intake/Output Summary (Last 24 hours) at 06/04/2023 1839 Last data filed at 06/04/2023 1800 Gross per 24 hour  Intake 900.04 ml  Output 850 ml  Net 50.04 ml   Exam:  Constitutional:  The patient is awake, alert, and oriented x 3. Mild distress from discomfort. Respiratory:  No increased work of breathing. No wheezes, rales, or rhonchi No tactile fremitus Cardiovascular:  Regular rate and rhythm No murmurs, ectopy, or gallups. No lateral PMI. No thrills. Abdomen:  Abdomen is soft, non-distended Positive for left lower quadrant tenderness No hernias, masses, or organomegaly Hypoactive bowel sounds.  Musculoskeletal:  No cyanosis, clubbing, or edema Skin:  No rashes, lesions, ulcers palpation of skin: no induration or  nodules Neurologic:  CN 2-12 intact Sensation all 4 extremities intact Psychiatric:  Mental status Mood, affect appropriate Orientation to person, place, time  judgment and insight appear intact    Data Review:    Recent Labs  Lab 06/01/23 1501 06/02/23 0514 06/03/23 0510 06/04/23 0436  WBC 12.9* 10.1 7.9 7.5  HGB 10.6* 9.8* 9.4* 9.3*  HCT 34.0* 31.1* 30.3* 30.0*  PLT 299 225 257 253  MCV 94.2 94.2 92.9 93.5  MCH 29.4 29.7 28.8 29.0  MCHC 31.2 31.5 31.0 31.0  RDW 13.5 13.7 13.7 13.4  LYMPHSABS  --   --   --  1.1  MONOABS  --   --   --  1.0  EOSABS  --   --   --  0.3  BASOSABS  --   --   --  0.0    Recent Labs  Lab 06/01/23 1501 06/01/23 1925 06/02/23 0514 06/03/23 0727 06/04/23 0436  NA 140  --  139 138 138  K 3.8  --  3.6 3.6 3.8  CL 111  --  107 110 107  CO2 19*  --  22 20* 23  ANIONGAP 10  --  10 8 8   GLUCOSE 137*  --  105* 90 99  BUN 37*  --  34* 27* 20  CREATININE 1.28*  --  0.97 0.90 0.95  AST 68*  --   --   --   --   ALT 45*  --   --   --   --   ALKPHOS 139*  --   --   --   --   BILITOT 1.3*  --   --   --   --   ALBUMIN 3.4*  --   --   --   --   LATICACIDVEN  --  0.7  --   --   --   MG  --   --  2.0  --   --   CALCIUM 8.6*  --  8.5* 8.2* 8.2*      Recent Labs  Lab 06/01/23 1501 06/01/23 1925 06/02/23 0514 06/03/23 0727 06/04/23 0436  LATICACIDVEN  --  0.7  --   --   --   MG  --   --  2.0  --   --   CALCIUM 8.6*  --  8.5* 8.2* 8.2*    --------------------------------------------------------------------------------------------------------------- No results found for: "CHOL", "HDL", "LDLCALC", "LDLDIRECT", "TRIG", "CHOLHDL"  No results found for: "HGBA1C" No results for input(s): "TSH", "T4TOTAL", "FREET4", "T3FREE", "THYROIDAB" in the last 72 hours. No results for input(s): "VITAMINB12", "FOLATE", "FERRITIN", "TIBC", "IRON", "RETICCTPCT" in the last 72  hours. ------------------------------------------------------------------------------------------------------------------ Cardiac Enzymes No results for input(s): "CKMB", "TROPONINI", "MYOGLOBIN" in the last 168 hours.  Invalid input(s): "CK"  Micro Results Recent Results (from the past 240 hours)  Blood culture (routine x 2)     Status: None (Preliminary result)   Collection Time: 06/01/23  5:49 PM   Specimen: BLOOD  Result  Value Ref Range Status   Specimen Description   Final    BLOOD SITE NOT SPECIFIED Performed at [facility], [address], [place], [place] [address]    Special Requests   Final    BOTTLES DRAWN AEROBIC AND ANAEROBIC Blood Culture results may not be optimal due to an inadequate volume of blood received in culture bottles Performed at [facility], [address], [place], [place] [address]    Culture   Final    NO GROWTH 3 DAYS Performed at [facility], [address] [place], [place] [address]    Report Status PENDING  Incomplete  Blood culture (routine x 2)     Status: None (Preliminary result)   Collection Time: 06/01/23  5:54 PM   Specimen: BLOOD  Result Value Ref Range Status   Specimen Description   Final    BLOOD BLOOD RIGHT ARM Performed at [facility], [address], [place], [place] [address]    Special Requests   Final    BOTTLES DRAWN AEROBIC AND ANAEROBIC Blood Culture results may not be optimal due to an inadequate volume of blood received in culture bottles Performed at [facility], [address], [place], [place] [address]    Culture   Final    NO GROWTH 3 DAYS Performed at [facility], [address] [place], [place] [address]    Report Status PENDING  Incomplete    Radiology Reports CT ABDOMEN PELVIS W CONTRAST Result Date: 06/01/2023 CLINICAL DATA:  Left lower quadrant pain, nausea, and vomiting for four days. EXAM: CT ABDOMEN AND  PELVIS WITH CONTRAST TECHNIQUE: Multidetector CT imaging of the abdomen and pelvis was performed using the standard protocol following bolus administration of intravenous contrast. RADIATION DOSE REDUCTION: This exam was performed according to the departmental dose-optimization program which includes automated exposure control, adjustment of the mA and/or kV according to patient size and/or use of iterative reconstruction technique. CONTRAST:  80mL OMNIPAQUE IOHEXOL 300 MG/ML  SOLN COMPARISON:  None Available. FINDINGS: Lower Chest: No acute findings. Hepatobiliary: No suspicious hepatic masses identified. Gallbladder is unremarkable. No evidence of biliary ductal dilatation. Pancreas:  No mass or inflammatory changes. Spleen: Within normal limits in size. Two sub-centimeter low-attenuation lesions are seen in the dome of the spleen which are too small to characterize, but likely represent benign hemangiomas Adrenals/Urinary Tract: No suspicious masses identified. No evidence of ureteral calculi or hydronephrosis. Stomach/Bowel: Moderate hiatal hernia. No evidence of bowel obstruction. Mild wall thickening and pneumatosis is seen involving small bowel in the left lower quadrant, with extraluminal gas tracking into the adjacent left abdominal mesentery. This is consistent with enteritis no No evidence of free intraperitoneal air or abscess. Anterior abdominal wall surgical mesh seen, without recurrent hernia. Vascular/Lymphatic: No pathologically enlarged lymph nodes. No acute vascular findings. Reproductive:  No mass or other significant abnormality. Other:  None. Musculoskeletal:  No suspicious bone lesions identified. IMPRESSION: Small bowel wall thickening in the left lower quadrant, with pneumatosis and extraluminal gas in the adjacent mesentery, likely due to acute enteritis with micro perforation. No evidence of abscess or bowel obstruction. Moderate hiatal hernia. Electronically Signed   By: Danae Orleans M.D.    On: 06/01/2023 17:32      Signature  -   Fran Lowes M.D on 06/04/2023 at 6:39 PM   -  To page go to [email or web address]

## 2023-06-05 DIAGNOSIS — K529 Noninfective gastroenteritis and colitis, unspecified: Secondary | ICD-10-CM | POA: Diagnosis not present

## 2023-06-05 LAB — BASIC METABOLIC PANEL
Anion gap: 10 (ref 5–15)
BUN: 15 mg/dL (ref 8–23)
CO2: 21 mmol/L — ABNORMAL LOW (ref 22–32)
Calcium: 7.9 mg/dL — ABNORMAL LOW (ref 8.9–10.3)
Chloride: 104 mmol/L (ref 98–111)
Creatinine, Ser: 0.65 mg/dL (ref 0.44–1.00)
GFR, Estimated: >60 mL/min (ref >60)
Glucose, Bld: 99 mg/dL (ref 70–99)
Potassium: 3.3 mmol/L — ABNORMAL LOW (ref 3.5–5.1)
Sodium: 135 mmol/L (ref 135–145)

## 2023-06-05 LAB — CBC WITH DIFFERENTIAL/PLATELET
Abs Immature Granulocytes: 0.14 10*3/uL — ABNORMAL HIGH (ref 0.00–0.07)
Basophils Absolute: 0.1 10*3/uL (ref 0.0–0.1)
Basophils Relative: 1 %
Eosinophils Absolute: 0.3 10*3/uL (ref 0.0–0.5)
Eosinophils Relative: 3 %
HCT: 30.6 % — ABNORMAL LOW (ref 36.0–46.0)
Hemoglobin: 9.4 g/dL — ABNORMAL LOW (ref 12.0–15.0)
Immature Granulocytes: 1 %
Lymphocytes Relative: 9 %
Lymphs Abs: 1 10*3/uL (ref 0.7–4.0)
MCH: 28.9 pg (ref 26.0–34.0)
MCHC: 30.7 g/dL (ref 30.0–36.0)
MCV: 94.2 fL (ref 80.0–100.0)
Monocytes Absolute: 1.3 10*3/uL — ABNORMAL HIGH (ref 0.1–1.0)
Monocytes Relative: 12 %
Neutro Abs: 8.4 10*3/uL — ABNORMAL HIGH (ref 1.7–7.7)
Neutrophils Relative %: 74 %
Platelets: 271 10*3/uL (ref 150–400)
RBC: 3.25 MIL/uL — ABNORMAL LOW (ref 3.87–5.11)
RDW: 13.6 % (ref 11.5–15.5)
WBC: 11.3 10*3/uL — ABNORMAL HIGH (ref 4.0–10.5)
nRBC: 0 % (ref 0.0–0.2)

## 2023-06-05 MED ORDER — HYDROMORPHONE HCL 1 MG/ML IJ SOLN
0.5000 mg | INTRAMUSCULAR | Status: DC | PRN
  Administered 2023-06-05 – 2023-06-06 (×3): 0.5 mg via INTRAVENOUS
  Filled 2023-06-05 (×3): qty 0.5

## 2023-06-05 NOTE — Progress Notes (Signed)
PROGRESS NOTE                                                                                                                                                                                                             Patient Demographics:    Adrienne Marsh, is a 68 y.o. female, DOB - [birth year], ZOX:[id number]  Outpatient Primary MD for the patient is [name], NP    LOS - 3  Admit date - 06/01/2023    Chief Complaint  Patient presents with   Abdominal Pain   Emesis       Brief Narrative (HPI from H&P)   Adrienne Marsh is a 68 y.o. female with medical history significant for PTSD, anxiety and depression, panic attacks, HLD, HTN, paroxysmal SVT, hiatal hernia, urge incontinence, diverticulitis s/p colon resection and gastritis who presented to the ED for evaluation of abdominal pain, nausea and vomiting.  CT A/P on admission showed evidence of acute cystitis with microperforation but no evidence of abscess or bowel obstruction.  General surgery was consulted and patient was started on IV Zosyn. They have now signed off and have recommended only treatment as for enteritis.   The patient states that while her pain is present, it is much improved. Her diet has been advanced. She will try eating more.    Subjective:    [name] today was evaluated laying comfortably in bed. No new complaints.  Assessment  & Plan :    Assessment and Plan:  Acute enteritis with microperforation She continues to have LLQ pain. Tolerating p.o. intake without nausea or vomiting this morning. Leukocytosis has resolved, she remains afebrile.  No surgical intervention per general surgery, signed off today. The recommendation is for treatment as for enteritis.  -Continue IV Zosyn -IV Dilaudid will be stopped. Oxycodone will be started in its place. -Blood cultures no growth after 48 hours -Anticipate discharge home when pain and ability to take  regular PO have improved. -Leukocytosis had initially decreased from 12.9 to 7.5 on 06/04/2023. Today it is elevated to 11.3 again. The patient's platelets have also increased. Perhaps this is due to hemoconcentration. Will recheck CBC in the am. There is no reason to believe that the infection is not under control as the pain is improving, and the patient has had no fevers.   AKI, resolved -Creatinine  has returned to baseline. Will stop IV fluids.   HTN BP improved with SBP in the 130s -Continue Lopressor -Resume home lisinopril   HLD -Continue atorvastatin   PTSD Anxiety and depression -Continue sertraline, prazosin and Ativan -Pt is quite anxious about the prospect of being discharged.   GERD -Continue Protonix.  Hiatal hernia Noted. Stable.  Gastritis -Continue Protonix and as needed Pepcid   Urge incontinence -Continue Myrbetriq  Obesity: Estimated body mass index is 25.09 kg/m as calculated from the following:   Height as of this encounter: 5\' 11"  (1.803 m).   Weight as of this encounter: 81.6 kg.          Condition -stable  Family Communication  : No family at bedside  Code Status : Full code  Consults  : General Surgery, signed off on 1/25  PUD Prophylaxis : Protonix   Procedures  :     None      Disposition Plan  :    Status is: Observation The patient will require care spanning > 2 midnights and should be moved to inpatient because: Ongoing abdominal pain and IV antibiotics for enteritis.  DVT Prophylaxis  :    enoxaparin (LOVENOX) injection 40 mg Start: 06/01/23 2200   Lab Results  Component Value Date   PLT 271 06/05/2023    Diet :  Diet Order             Diet regular Room service appropriate? Yes; Fluid consistency: Thin  Diet effective now                    Inpatient Medications  Scheduled Meds:  atorvastatin  20 mg Oral Daily   enoxaparin (LOVENOX) injection  40 mg Subcutaneous Q24H   lisinopril  10 mg Oral Daily    melatonin  5 mg Oral Once   metoprolol tartrate  12.5-25 mg Oral Daily   mirabegron ER  25 mg Oral QHS   multivitamin with minerals  1 tablet Oral Q breakfast   pantoprazole  40 mg Oral QHS   prazosin  1 mg Oral QHS   sertraline  200 mg Oral Daily   Continuous Infusions:  piperacillin-tazobactam (ZOSYN)  IV 3.375 g (06/05/23 1208)   PRN Meds:.acetaminophen **OR** acetaminophen, famotidine, HYDROmorphone (DILAUDID) injection, LORazepam, metoprolol tartrate, ondansetron **OR** ondansetron (ZOFRAN) IV, senna-docusate  Antibiotics  :    Anti-infectives (From admission, onward)    Start     Dose/Rate Route Frequency Ordered Stop   06/02/23 0400  piperacillin-tazobactam (ZOSYN) IVPB 3.375 g        3.375 g 12.5 mL/hr over 240 Minutes Intravenous Every 8 hours 06/02/23 0000     06/01/23 1800  piperacillin-tazobactam (ZOSYN) IVPB 3.375 g        3.375 g 100 mL/hr over 30 Minutes Intravenous  Once 06/01/23 1749 06/01/23 2025         Objective:   Vitals:   06/05/23 0840 06/05/23 0900 06/05/23 1038 06/05/23 1152  BP: (!) 138/57 (!) 138/57 (!) 124/54 (!) 124/57  Pulse: 76 76 75 63  Resp:   17 18  Temp:   98.4 F (36.9 C) 98.4 F (36.9 C)  TempSrc:   Oral Oral  SpO2:   96% 97%  Weight:      Height:        Wt Readings from Last 3 Encounters:  06/01/23 81.6 kg  02/04/22 81.6 kg  08/21/18 86.2 kg     Intake/Output Summary (Last 24 hours) at  06/05/2023 1700 Last data filed at 06/05/2023 1153 Gross per 24 hour  Intake 1028.97 ml  Output 1600 ml  Net -571.03 ml   Exam:  Constitutional:  The patient is awake, alert, and oriented x 3. No acute distress. Respiratory:  No increased work of breathing. No wheezes, rales, or rhonchi No tactile fremitus Cardiovascular:  Regular rate and rhythm No murmurs, ectopy, or gallups. No lateral PMI. No thrills. Abdomen:  Abdomen is soft, non-distended Positive for left lower quadrant tenderness No hernias, masses, or  organomegaly Hypoactive bowel sounds.  Musculoskeletal:  No cyanosis, clubbing, or edema Skin:  No rashes, lesions, ulcers palpation of skin: no induration or nodules Neurologic:  CN 2-12 intact Sensation all 4 extremities intact Psychiatric:  Mental status Mood, affect appropriate Orientation to person, place, time  judgment and insight appear intact    Data Review:    Recent Labs  Lab 06/01/23 1501 06/02/23 0514 06/03/23 0510 06/04/23 0436 06/05/23 0506  WBC 12.9* 10.1 7.9 7.5 11.3*  HGB 10.6* 9.8* 9.4* 9.3* 9.4*  HCT 34.0* 31.1* 30.3* 30.0* 30.6*  PLT 299 225 257 253 271  MCV 94.2 94.2 92.9 93.5 94.2  MCH 29.4 29.7 28.8 29.0 28.9  MCHC 31.2 31.5 31.0 31.0 30.7  RDW 13.5 13.7 13.7 13.4 13.6  LYMPHSABS  --   --   --  1.1 1.0  MONOABS  --   --   --  1.0 1.3*  EOSABS  --   --   --  0.3 0.3  BASOSABS  --   --   --  0.0 0.1    Recent Labs  Lab 06/01/23 1501 06/01/23 1925 06/02/23 0514 06/03/23 0727 06/04/23 0436 06/05/23 0506  NA 140  --  139 138 138 135  K 3.8  --  3.6 3.6 3.8 3.3*  CL 111  --  107 110 107 104  CO2 19*  --  22 20* 23 21*  ANIONGAP 10  --  10 8 8 10   GLUCOSE 137*  --  105* 90 99 99  BUN 37*  --  34* 27* 20 15  CREATININE 1.28*  --  0.97 0.90 0.95 0.65  AST 68*  --   --   --   --   --   ALT 45*  --   --   --   --   --   ALKPHOS 139*  --   --   --   --   --   BILITOT 1.3*  --   --   --   --   --   ALBUMIN 3.4*  --   --   --   --   --   LATICACIDVEN  --  0.7  --   --   --   --   MG  --   --  2.0  --   --   --   CALCIUM 8.6*  --  8.5* 8.2* 8.2* 7.9*      Recent Labs  Lab 06/01/23 1501 06/01/23 1925 06/02/23 0514 06/03/23 0727 06/04/23 0436 06/05/23 0506  LATICACIDVEN  --  0.7  --   --   --   --   MG  --   --  2.0  --   --   --   CALCIUM 8.6*  --  8.5* 8.2* 8.2* 7.9*    --------------------------------------------------------------------------------------------------------------- No results found for: "CHOL", "HDL", "LDLCALC",  "LDLDIRECT", "TRIG", "CHOLHDL"  No results found for: "HGBA1C" No results for input(s): "  TSH", "T4TOTAL", "FREET4", "T3FREE", "THYROIDAB" in the last 72 hours. No results for input(s): "VITAMINB12", "FOLATE", "FERRITIN", "TIBC", "IRON", "RETICCTPCT" in the last 72 hours. ------------------------------------------------------------------------------------------------------------------ Cardiac Enzymes No results for input(s): "CKMB", "TROPONINI", "MYOGLOBIN" in the last 168 hours.  Invalid input(s): "CK"  Micro Results Recent Results (from the past 240 hours)  Blood culture (routine x 2)     Status: None (Preliminary result)   Collection Time: 06/01/23  5:49 PM   Specimen: BLOOD  Result Value Ref Range Status   Specimen Description   Final    BLOOD SITE NOT SPECIFIED Performed at [facility], [address] [place], [place] [address]    Special Requests   Final    BOTTLES DRAWN AEROBIC AND ANAEROBIC Blood Culture results may not be optimal due to an inadequate volume of blood received in culture bottles Performed at [facility], [address], [place], [place] [address]    Culture   Final    NO GROWTH 4 DAYS Performed at [facility], [address], [place], [place] [address]    Report Status PENDING  Incomplete  Blood culture (routine x 2)     Status: None (Preliminary result)   Collection Time: 06/01/23  5:54 PM   Specimen: BLOOD  Result Value Ref Range Status   Specimen Description   Final    BLOOD BLOOD RIGHT ARM Performed at [facility], [address] [place], [place] [address]    Special Requests   Final    BOTTLES DRAWN AEROBIC AND ANAEROBIC Blood Culture results may not be optimal due to an inadequate volume of blood received in culture bottles Performed at [facility], [address], [place], [place] [address]    Culture   Final    NO GROWTH 4 DAYS Performed at [facility], [address], [place], [place] [address]    Report Status PENDING  Incomplete    Radiology Reports CT ABDOMEN PELVIS W CONTRAST Result Date: 06/01/2023 CLINICAL DATA:  Left lower quadrant pain, nausea, and vomiting for four days. EXAM: CT ABDOMEN AND PELVIS WITH CONTRAST TECHNIQUE: Multidetector CT imaging of the abdomen and pelvis was performed using the standard protocol following bolus administration of intravenous contrast. RADIATION DOSE REDUCTION: This exam was performed according to the departmental dose-optimization program which includes automated exposure control, adjustment of the mA and/or kV according to patient size and/or use of iterative reconstruction technique. CONTRAST:  80mL OMNIPAQUE IOHEXOL 300 MG/ML  SOLN COMPARISON:  None Available. FINDINGS: Lower Chest: No acute findings. Hepatobiliary: No suspicious hepatic masses identified. Gallbladder is unremarkable. No evidence of biliary ductal dilatation. Pancreas:  No mass or inflammatory changes. Spleen: Within normal limits in size. Two sub-centimeter low-attenuation lesions are seen in the dome of the spleen which are too small to characterize, but likely represent benign hemangiomas Adrenals/Urinary Tract: No suspicious masses identified. No evidence of ureteral calculi or hydronephrosis. Stomach/Bowel: Moderate hiatal hernia. No evidence of bowel obstruction. Mild wall thickening and pneumatosis is seen involving small bowel in the left lower quadrant, with extraluminal gas tracking into the adjacent left abdominal mesentery. This is consistent with enteritis no No evidence of free intraperitoneal air or abscess. Anterior abdominal wall surgical mesh seen, without recurrent hernia. Vascular/Lymphatic: No pathologically enlarged lymph nodes. No acute vascular findings. Reproductive:  No mass or other significant abnormality. Other:  None. Musculoskeletal:  No suspicious bone lesions identified. IMPRESSION: Small bowel wall  thickening in  the left lower quadrant, with pneumatosis and extraluminal gas in the adjacent mesentery, likely due to acute enteritis with micro perforation. No evidence of abscess or bowel obstruction. Moderate hiatal hernia. Electronically Signed   By: Danae Orleans M.D.   On: 06/01/2023 17:32      Signature  -   Fran Lowes M.D on 06/05/2023 at 5:00 PM   -  To page go to [email or web address]

## 2023-06-05 NOTE — Progress Notes (Signed)
Mobility Specialist - Progress Note   06/05/23 1538  Mobility  Activity Ambulated independently in hallway;Ambulated independently to bathroom  Level of Assistance Standby assist, set-up cues, supervision of patient - no hands on  Assistive Device Other (Comment) (IV Pole)  Distance Ambulated (ft) 500 ft  Activity Response Tolerated well  Mobility Referral Yes  Mobility visit 1 Mobility  Mobility Specialist Start Time (ACUTE ONLY) 1520  Mobility Specialist Stop Time (ACUTE ONLY) 1539  Mobility Specialist Time Calculation (min) (ACUTE ONLY) 19 min   Pt received in bed and agreeable to mobility. Prior to ambulating, pt requested assistance to the bathroom. No complaints during session. Pt to bed after session with all needs met.    [facility]

## 2023-06-05 NOTE — Plan of Care (Signed)

## 2023-06-06 ENCOUNTER — Inpatient Hospital Stay (HOSPITAL_COMMUNITY): Payer: Medicare Other

## 2023-06-06 DIAGNOSIS — K529 Noninfective gastroenteritis and colitis, unspecified: Secondary | ICD-10-CM | POA: Diagnosis not present

## 2023-06-06 LAB — BASIC METABOLIC PANEL
Anion gap: 11 (ref 5–15)
BUN: 17 mg/dL (ref 8–23)
CO2: 25 mmol/L (ref 22–32)
Calcium: 8.6 mg/dL — ABNORMAL LOW (ref 8.9–10.3)
Chloride: 107 mmol/L (ref 98–111)
Creatinine, Ser: 0.48 mg/dL (ref 0.44–1.00)
GFR, Estimated: >60 mL/min (ref >60)
Glucose, Bld: 74 mg/dL (ref 70–99)
Potassium: 4 mmol/L (ref 3.5–5.1)
Sodium: 143 mmol/L (ref 135–145)

## 2023-06-06 LAB — CBC WITH DIFFERENTIAL/PLATELET
Abs Immature Granulocytes: 0.24 10*3/uL — ABNORMAL HIGH (ref 0.00–0.07)
Basophils Absolute: 0.1 10*3/uL (ref 0.0–0.1)
Basophils Relative: 1 %
Eosinophils Absolute: 0.4 10*3/uL (ref 0.0–0.5)
Eosinophils Relative: 4 %
HCT: 31.3 % — ABNORMAL LOW (ref 36.0–46.0)
Hemoglobin: 9.6 g/dL — ABNORMAL LOW (ref 12.0–15.0)
Immature Granulocytes: 2 %
Lymphocytes Relative: 12 %
Lymphs Abs: 1.4 10*3/uL (ref 0.7–4.0)
MCH: 28.9 pg (ref 26.0–34.0)
MCHC: 30.7 g/dL (ref 30.0–36.0)
MCV: 94.3 fL (ref 80.0–100.0)
Monocytes Absolute: 1.2 10*3/uL — ABNORMAL HIGH (ref 0.1–1.0)
Monocytes Relative: 10 %
Neutro Abs: 8.3 10*3/uL — ABNORMAL HIGH (ref 1.7–7.7)
Neutrophils Relative %: 71 %
Platelets: 322 10*3/uL (ref 150–400)
RBC: 3.32 MIL/uL — ABNORMAL LOW (ref 3.87–5.11)
RDW: 13.6 % (ref 11.5–15.5)
WBC: 11.6 10*3/uL — ABNORMAL HIGH (ref 4.0–10.5)
nRBC: 0 % (ref 0.0–0.2)

## 2023-06-06 LAB — CULTURE, BLOOD (ROUTINE X 2)
Culture: NO GROWTH
Culture: NO GROWTH

## 2023-06-06 MED ORDER — IOHEXOL 300 MG/ML  SOLN
30.0000 mL | Freq: Once | INTRAMUSCULAR | Status: AC | PRN
  Administered 2023-06-06: 30 mL via ORAL

## 2023-06-06 MED ORDER — OXYCODONE HCL 5 MG PO TABS
5.0000 mg | ORAL_TABLET | Freq: Four times a day (QID) | ORAL | Status: DC | PRN
  Administered 2023-06-06 – 2023-06-08 (×8): 5 mg via ORAL
  Filled 2023-06-06 (×8): qty 1

## 2023-06-06 MED ORDER — METOPROLOL TARTRATE 25 MG PO TABS
37.5000 mg | ORAL_TABLET | Freq: Two times a day (BID) | ORAL | Status: DC
  Administered 2023-06-06 – 2023-06-08 (×4): 37.5 mg via ORAL
  Filled 2023-06-06 (×4): qty 1

## 2023-06-06 MED ORDER — IOHEXOL 300 MG/ML  SOLN
100.0000 mL | Freq: Once | INTRAMUSCULAR | Status: AC | PRN
  Administered 2023-06-06: 100 mL via INTRAVENOUS

## 2023-06-06 NOTE — Progress Notes (Signed)
   06/06/23 0841  TOC Brief Assessment  Insurance and Status Reviewed  Patient has primary care physician Yes  Home environment has been reviewed home with family  Prior level of function: independent  Prior/Current Home Services No current home services  Social Drivers of Health Review SDOH reviewed no interventions necessary  Readmission risk has been reviewed Yes  Transition of care needs no transition of care needs at this time

## 2023-06-06 NOTE — Plan of Care (Signed)
?  Problem: Clinical Measurements: ?Goal: Will remain free from infection ?Outcome: Progressing ?  ?

## 2023-06-06 NOTE — Progress Notes (Signed)
PROGRESS NOTE                                                                                                                                                                                                             Patient Demographics:    Adrienne Marsh, is a 68 y.o. female, DOB - 1956/01/20, ZOX:[id number]  Outpatient Primary MD for the patient is [name], NP    LOS - 4  Admit date - 06/01/2023    Chief Complaint  Patient presents with   Abdominal Pain   Emesis       Brief Narrative (HPI from H&P)   Adrienne Marsh is a 68 y.o. female with medical history significant for PTSD, anxiety and depression, panic attacks, HLD, HTN, paroxysmal SVT, hiatal hernia, urge incontinence, diverticulitis s/p colon resection and gastritis who presented to the ED for evaluation of abdominal pain, nausea and vomiting.  CT A/P on admission showed evidence of acute cystitis with microperforation but no evidence of abscess or bowel obstruction.  General surgery was consulted and patient was started on IV Zosyn. They have now signed off and have recommended only treatment as for enteritis.   The patient is extremely anxious. At one moment the patient will say that her pain is better. In the next moment she will say that it is only better because of the pain medication. As her WBC has increased from 7.5 to 11.3 - 11.6 and her physical exam is equivocal and the patient is not consistent when reporting on her pain, the patient will be sent for CT of the abdomen and pelvis to clarify the status of her microperforations.    Subjective:    Adrienne Marsh today was evaluated laying comfortably in bed. No new complaints.  Assessment  & Plan :    Assessment and Plan:  Acute enteritis with microperforation She continues to have LLQ pain. Tolerating p.o. intake without nausea or vomiting this morning. Leukocytosis has resolved, she remains afebrile.  No  surgical intervention per general surgery, signed off today. The recommendation is for treatment as for enteritis.  -Continue IV Zosyn -IV Dilaudid will be stopped. Oxycodone will be started in its place. -Blood cultures no growth after 48 hours -Anticipate discharge home when pain and ability to take regular PO have improved. -Leukocytosis had initially decreased from 12.9 to  7.5 on 06/04/2023. Today it is elevated to 11.6 again. The patient's platelets have also increased. Perhaps this is due to hemoconcentration, but as the patient is unclear about her symptoms, I will send the patient for CT of the abdomen and pelvis to clarify.  AKI, resolved -Creatinine has returned to baseline. IV fluids stopped.   HTN BP improved with SBP in the 130s -Continue Lopressor -Resume home lisinopril   HLD -Continue atorvastatin   PTSD Anxiety and depression -Continue sertraline, prazosin and Ativan -Pt is quite anxious about the prospect of being discharged.   GERD -Continue Protonix.  Hiatal hernia Noted. Stable.  Gastritis -Continue Protonix and as needed Pepcid   Urge incontinence -Continue Myrbetriq  Obesity: Estimated body mass index is 25.09 kg/m as calculated from the following:   Height as of this encounter: 5\' 11"  (1.803 m).   Weight as of this encounter: 81.6 kg.          Condition -stable  Family Communication  : No family at bedside  Code Status : Full code  Consults  : General Surgery, signed off on 1/25  PUD Prophylaxis : Protonix   Procedures  :     None      Disposition Plan  :    Status is: Observation The patient will require care spanning > 2 midnights and should be moved to inpatient because: Ongoing abdominal pain and IV antibiotics for enteritis.  DVT Prophylaxis  :    enoxaparin (LOVENOX) injection 40 mg Start: 06/01/23 2200   Lab Results  Component Value Date   PLT 322 06/06/2023    Diet :  Diet Order             Diet regular  Room service appropriate? Yes; Fluid consistency: Thin  Diet effective now                    Inpatient Medications  Scheduled Meds:  atorvastatin  20 mg Oral Daily   enoxaparin (LOVENOX) injection  40 mg Subcutaneous Q24H   lisinopril  10 mg Oral Daily   melatonin  5 mg Oral Once   metoprolol tartrate  12.5-25 mg Oral Daily   mirabegron ER  25 mg Oral QHS   multivitamin with minerals  1 tablet Oral Q breakfast   pantoprazole  40 mg Oral QHS   prazosin  1 mg Oral QHS   sertraline  200 mg Oral Daily   Continuous Infusions:  piperacillin-tazobactam (ZOSYN)  IV 3.375 g (06/06/23 1200)   PRN Meds:.acetaminophen **OR** acetaminophen, famotidine, iohexol, LORazepam, metoprolol tartrate, ondansetron **OR** ondansetron (ZOFRAN) IV, oxyCODONE, senna-docusate  Antibiotics  :    Anti-infectives (From admission, onward)    Start     Dose/Rate Route Frequency Ordered Stop   06/02/23 0400  piperacillin-tazobactam (ZOSYN) IVPB 3.375 g        3.375 g 12.5 mL/hr over 240 Minutes Intravenous Every 8 hours 06/02/23 0000     06/01/23 1800  piperacillin-tazobactam (ZOSYN) IVPB 3.375 g        3.375 g 100 mL/hr over 30 Minutes Intravenous  Once 06/01/23 1749 06/01/23 2025         Objective:   Vitals:   06/05/23 1152 06/05/23 2134 06/06/23 0533 06/06/23 0759  BP: (!) 124/57 (!) 143/61 (!) 144/60 (!) 151/66  Pulse: 63 81 72 80  Resp: 18 18 20 14   Temp: 98.4 F (36.9 C) 98.8 F (37.1 C) 97.9 F (36.6 C) 97.6 F (36.4 C)  TempSrc:  Oral   Oral  SpO2: 97% 92% 95% 96%  Weight:      Height:        Wt Readings from Last 3 Encounters:  06/01/23 81.6 kg  02/04/22 81.6 kg  08/21/18 86.2 kg     Intake/Output Summary (Last 24 hours) at 06/06/2023 1518 Last data filed at 06/06/2023 1036 Gross per 24 hour  Intake 1323.12 ml  Output 500 ml  Net 823.12 ml   Exam:  Constitutional:  The patient is awake, alert, and oriented x 3. No acute distress. Respiratory:  No increased work  of breathing. No wheezes, rales, or rhonchi No tactile fremitus Cardiovascular:  Regular rate and rhythm No murmurs, ectopy, or gallups. No lateral PMI. No thrills. Abdomen:  Abdomen is soft, non-distended Pt grimaces with palpation of the left lower quadrant No hernias, masses, or organomegaly Normo-active bowel sounds.  Musculoskeletal:  No cyanosis, clubbing, or edema Skin:  No rashes, lesions, ulcers palpation of skin: no induration or nodules Neurologic:  CN 2-12 intact Sensation all 4 extremities intact Psychiatric:  Mental status Mood, affect appropriate Orientation to person, place, time  judgment and insight appear intact    Data Review:    Recent Labs  Lab 06/02/23 0514 06/03/23 0510 06/04/23 0436 06/05/23 0506 06/06/23 0527  WBC 10.1 7.9 7.5 11.3* 11.6*  HGB 9.8* 9.4* 9.3* 9.4* 9.6*  HCT 31.1* 30.3* 30.0* 30.6* 31.3*  PLT 225 257 253 271 322  MCV 94.2 92.9 93.5 94.2 94.3  MCH 29.7 28.8 29.0 28.9 28.9  MCHC 31.5 31.0 31.0 30.7 30.7  RDW 13.7 13.7 13.4 13.6 13.6  LYMPHSABS  --   --  1.1 1.0 1.4  MONOABS  --   --  1.0 1.3* 1.2*  EOSABS  --   --  0.3 0.3 0.4  BASOSABS  --   --  0.0 0.1 0.1    Recent Labs  Lab 06/01/23 1501 06/01/23 1925 06/02/23 0514 06/03/23 0727 06/04/23 0436 06/05/23 0506 06/06/23 0527  NA 140  --  139 138 138 135 143  K 3.8  --  3.6 3.6 3.8 3.3* 4.0  CL 111  --  107 110 107 104 107  CO2 19*  --  22 20* 23 21* 25  ANIONGAP 10  --  10 8 8 10 11   GLUCOSE 137*  --  105* 90 99 99 74  BUN 37*  --  34* 27* 20 15 17   CREATININE 1.28*  --  0.97 0.90 0.95 0.65 0.48  AST 68*  --   --   --   --   --   --   ALT 45*  --   --   --   --   --   --   ALKPHOS 139*  --   --   --   --   --   --   BILITOT 1.3*  --   --   --   --   --   --   ALBUMIN 3.4*  --   --   --   --   --   --   LATICACIDVEN  --  0.7  --   --   --   --   --   MG  --   --  2.0  --   --   --   --   CALCIUM 8.6*  --  8.5* 8.2* 8.2* 7.9* 8.6*      Recent Labs  Lab  06/01/23 1925  06/02/23 0514 06/03/23 0727 06/04/23 0436 06/05/23 0506 06/06/23 0527  LATICACIDVEN 0.7  --   --   --   --   --   MG  --  2.0  --   --   --   --   CALCIUM  --  8.5* 8.2* 8.2* 7.9* 8.6*    --------------------------------------------------------------------------------------------------------------- No results found for: "CHOL", "HDL", "LDLCALC", "LDLDIRECT", "TRIG", "CHOLHDL"  No results found for: "HGBA1C" No results for input(s): "TSH", "T4TOTAL", "FREET4", "T3FREE", "THYROIDAB" in the last 72 hours. No results for input(s): "VITAMINB12", "FOLATE", "FERRITIN", "TIBC", "IRON", "RETICCTPCT" in the last 72 hours. ------------------------------------------------------------------------------------------------------------------ Cardiac Enzymes No results for input(s): "CKMB", "TROPONINI", "MYOGLOBIN" in the last 168 hours.  Invalid input(s): "CK"  Micro Results Recent Results (from the past 240 hours)  Blood culture (routine x 2)     Status: None   Collection Time: 06/01/23  5:49 PM   Specimen: BLOOD  Result Value Ref Range Status   Specimen Description   Final    BLOOD SITE NOT SPECIFIED Performed at [facility], [address], [place], [place] [address]    Special Requests   Final    BOTTLES DRAWN AEROBIC AND ANAEROBIC Blood Culture results may not be optimal due to an inadequate volume of blood received in culture bottles Performed at [facility], [address], [place], [place] [address]    Culture   Final    NO GROWTH 5 DAYS Performed at [facility], [address] [place], [place] [address]    Report Status 06/06/2023 FINAL  Final  Blood culture (routine x 2)     Status: None   Collection Time: 06/01/23  5:54 PM   Specimen: BLOOD  Result Value Ref Range Status   Specimen Description   Final    BLOOD BLOOD RIGHT ARM Performed at [facility], [address] [place], [place]  [address]    Special Requests   Final    BOTTLES DRAWN AEROBIC AND ANAEROBIC Blood Culture results may not be optimal due to an inadequate volume of blood received in culture bottles Performed at [facility], [address], [place], [place] [address]    Culture   Final    NO GROWTH 5 DAYS Performed at [facility], [address], [place], [place] [address]    Report Status 06/06/2023 FINAL  Final    Radiology Reports No results found.     Signature  -   Fran Lowes M.D on 06/06/2023 at 3:18 PM   -  To page go to [email or web address]

## 2023-06-07 DIAGNOSIS — K529 Noninfective gastroenteritis and colitis, unspecified: Secondary | ICD-10-CM | POA: Diagnosis not present

## 2023-06-07 LAB — CBC WITH DIFFERENTIAL/PLATELET
Abs Immature Granulocytes: 0.26 10*3/uL — ABNORMAL HIGH (ref 0.00–0.07)
Basophils Absolute: 0.1 10*3/uL (ref 0.0–0.1)
Basophils Relative: 1 %
Eosinophils Absolute: 0.5 10*3/uL (ref 0.0–0.5)
Eosinophils Relative: 4 %
HCT: 30.8 % — ABNORMAL LOW (ref 36.0–46.0)
Hemoglobin: 9.8 g/dL — ABNORMAL LOW (ref 12.0–15.0)
Immature Granulocytes: 2 %
Lymphocytes Relative: 13 %
Lymphs Abs: 1.4 10*3/uL (ref 0.7–4.0)
MCH: 29.2 pg (ref 26.0–34.0)
MCHC: 31.8 g/dL (ref 30.0–36.0)
MCV: 91.7 fL (ref 80.0–100.0)
Monocytes Absolute: 0.9 10*3/uL (ref 0.1–1.0)
Monocytes Relative: 8 %
Neutro Abs: 8 10*3/uL — ABNORMAL HIGH (ref 1.7–7.7)
Neutrophils Relative %: 72 %
Platelets: 328 10*3/uL (ref 150–400)
RBC: 3.36 MIL/uL — ABNORMAL LOW (ref 3.87–5.11)
RDW: 13.4 % (ref 11.5–15.5)
WBC: 11.2 10*3/uL — ABNORMAL HIGH (ref 4.0–10.5)
nRBC: 0 % (ref 0.0–0.2)

## 2023-06-07 LAB — BASIC METABOLIC PANEL
Anion gap: 9 (ref 5–15)
BUN: 16 mg/dL (ref 8–23)
CO2: 24 mmol/L (ref 22–32)
Calcium: 8.5 mg/dL — ABNORMAL LOW (ref 8.9–10.3)
Chloride: 107 mmol/L (ref 98–111)
Creatinine, Ser: 0.75 mg/dL (ref 0.44–1.00)
GFR, Estimated: >60 mL/min (ref >60)
Glucose, Bld: 93 mg/dL (ref 70–99)
Potassium: 3.9 mmol/L (ref 3.5–5.1)
Sodium: 140 mmol/L (ref 135–145)

## 2023-06-07 MED ORDER — AMOXICILLIN-POT CLAVULANATE 875-125 MG PO TABS
1.0000 | ORAL_TABLET | Freq: Two times a day (BID) | ORAL | Status: DC
  Administered 2023-06-07 – 2023-06-08 (×2): 1 via ORAL
  Filled 2023-06-07 (×2): qty 1

## 2023-06-07 NOTE — Progress Notes (Signed)
PROGRESS NOTE                                                                                                                                                                                                             Patient Demographics:    Adrienne Marsh, is a 68 y.o. female, DOB - 02-13-1956, [id number]  Outpatient Primary MD for the patient is [name], NP    LOS - 5  Admit date - 06/01/2023    Chief Complaint  Patient presents with   Abdominal Pain   Emesis       Brief Narrative (HPI from H&P)   Adrienne Marsh is a 68 y.o. female with medical history significant for PTSD, anxiety and depression, panic attacks, HLD, HTN, paroxysmal SVT, hiatal hernia, urge incontinence, diverticulitis s/p colon resection and gastritis who presented to the ED for evaluation of abdominal pain, nausea and vomiting.  CT A/P on admission showed evidence of acute cystitis with microperforation but no evidence of abscess or bowel obstruction.  General surgery was consulted and patient was started on IV Zosyn. They have now signed off and have recommended only treatment as for enteritis.   The patient is extremely anxious. At one moment the patient will say that her pain is better. In the next moment she will say that it is only better because of the pain medication. As her WBC has increased from 7.5 to 11.3 - 11.6 and her physical exam is equivocal and the patient is not consistent when reporting on her pain.  CT of the patient's abdomen and pelvis demonstrated a distended gallbladder without signs of acute cholecystitis. The microperf and enteritis were not mentioned. The WBC has decreased to 11.2.  The patient will be ambulated in the halls and converted to oral antibiotics. It is anticipated that she will be discharged to home tomorrow.   Subjective:    Adrienne Marsh today was evaluated laying comfortably in bed. No new complaints.   Assessment  & Plan :    Assessment and Plan:  Acute enteritis with microperforation She continues to have LLQ pain. Tolerating p.o. intake without nausea or vomiting this morning. Leukocytosis has resolved, she remains afebrile.  No surgical intervention per general surgery, signed off today. The recommendation is for treatment as for enteritis.  -TRansition to oral antibiotics. -IV Dilaudid will  be stopped. Oxycodone will be started in its place. -Blood cultures no growth after 48 hours -Anticipate discharge home when pain and ability to take regular PO have improved. -CT of the patient's abdomen and pelvis demonstrated a distended gallbladder without signs of acute cholecystitis. The microperf and enteritis were not mentioned.-The WBC has decreased to 11.2.   AKI, resolved -Creatinine has returned to baseline. IV fluids stopped.   HTN BP improved with SBP in the 130s -Continue Lopressor -Resume home lisinopril   HLD -Continue atorvastatin   PTSD Anxiety and depression -Continue sertraline, prazosin and Ativan -Pt is quite anxious about the prospect of being discharged.   GERD -Continue Protonix.  Hiatal hernia Noted. Stable.  Gastritis -Continue Protonix and as needed Pepcid   Urge incontinence -Continue Myrbetriq  Obesity: Estimated body mass index is 25.09 kg/m as calculated from the following:   Height as of this encounter: 5\' 11"  (1.803 m).   Weight as of this encounter: 81.6 kg.          Condition -stable  Family Communication  : No family at bedside  Code Status : Full code  Consults  : General Surgery, signed off on 1/25  PUD Prophylaxis : Protonix   Procedures  :     None      Disposition Plan  :    Status is: Observation The patient will require care spanning > 2 midnights and should be moved to inpatient because: Ongoing abdominal pain and IV antibiotics for enteritis.  DVT Prophylaxis  :    enoxaparin (LOVENOX) injection 40 mg  Start: 06/01/23 2200   Lab Results  Component Value Date   PLT 328 06/07/2023    Diet :  Diet Order             Diet regular Room service appropriate? Yes; Fluid consistency: Thin  Diet effective now                    Inpatient Medications  Scheduled Meds:  atorvastatin  20 mg Oral Daily   enoxaparin (LOVENOX) injection  40 mg Subcutaneous Q24H   lisinopril  10 mg Oral Daily   melatonin  5 mg Oral Once   metoprolol tartrate  37.5 mg Oral BID   mirabegron ER  25 mg Oral QHS   multivitamin with minerals  1 tablet Oral Q breakfast   pantoprazole  40 mg Oral QHS   prazosin  1 mg Oral QHS   sertraline  200 mg Oral Daily   Continuous Infusions:  piperacillin-tazobactam (ZOSYN)  IV 3.375 g (06/07/23 1200)   PRN Meds:.acetaminophen **OR** acetaminophen, famotidine, LORazepam, ondansetron **OR** ondansetron (ZOFRAN) IV, oxyCODONE, senna-docusate  Antibiotics  :    Anti-infectives (From admission, onward)    Start     Dose/Rate Route Frequency Ordered Stop   06/02/23 0400  piperacillin-tazobactam (ZOSYN) IVPB 3.375 g        3.375 g 12.5 mL/hr over 240 Minutes Intravenous Every 8 hours 06/02/23 0000     06/01/23 1800  piperacillin-tazobactam (ZOSYN) IVPB 3.375 g        3.375 g 100 mL/hr over 30 Minutes Intravenous  Once 06/01/23 1749 06/01/23 2025         Objective:   Vitals:   06/06/23 2148 06/07/23 0605 06/07/23 0804 06/07/23 1352  BP: (!) 158/63 (!) 137/57 (!) 141/63 (!) 150/70  Pulse: 73 (!) 58 65 67  Resp: 16 16 18 16   Temp: 98.6 F (37 C) (!) 97.5 F (  36.4 C) 98.5 F (36.9 C) 98.1 F (36.7 C)  TempSrc: Oral Oral Oral Oral  SpO2: 95% 95% 95% 97%  Weight:      Height:        Wt Readings from Last 3 Encounters:  06/01/23 81.6 kg  02/04/22 81.6 kg  08/21/18 86.2 kg     Intake/Output Summary (Last 24 hours) at 06/07/2023 1556 Last data filed at 06/07/2023 1400 Gross per 24 hour  Intake 1797.14 ml  Output 2150 ml  Net -352.86 ml    Exam:  Constitutional:  The patient is awake, alert, and oriented x 3. No acute distress. Respiratory:  No increased work of breathing. No wheezes, rales, or rhonchi No tactile fremitus Cardiovascular:  Regular rate and rhythm No murmurs, ectopy, or gallups. No lateral PMI. No thrills. Abdomen:  Abdomen is soft, non-distended, and non-tender No hernias, masses, or organomegaly Normo-active bowel sounds.  Musculoskeletal:  No cyanosis, clubbing, or edema Skin:  No rashes, lesions, ulcers palpation of skin: no induration or nodules Neurologic:  CN 2-12 intact Sensation all 4 extremities intact Psychiatric:  Mental status Mood, affect appropriate Orientation to person, place, time  judgment and insight appear intact    Data Review:    Recent Labs  Lab 06/03/23 0510 06/04/23 0436 06/05/23 0506 06/06/23 0527 06/07/23 0502  WBC 7.9 7.5 11.3* 11.6* 11.2*  HGB 9.4* 9.3* 9.4* 9.6* 9.8*  HCT 30.3* 30.0* 30.6* 31.3* 30.8*  PLT 257 253 271 322 328  MCV 92.9 93.5 94.2 94.3 91.7  MCH 28.8 29.0 28.9 28.9 29.2  MCHC 31.0 31.0 30.7 30.7 31.8  RDW 13.7 13.4 13.6 13.6 13.4  LYMPHSABS  --  1.1 1.0 1.4 1.4  MONOABS  --  1.0 1.3* 1.2* 0.9  EOSABS  --  0.3 0.3 0.4 0.5  BASOSABS  --  0.0 0.1 0.1 0.1    Recent Labs  Lab 06/01/23 1501 06/01/23 1925 06/02/23 0514 06/03/23 0727 06/04/23 0436 06/05/23 0506 06/06/23 0527 06/07/23 0502  NA 140  --  139 138 138 135 143 140  K 3.8  --  3.6 3.6 3.8 3.3* 4.0 3.9  CL 111  --  107 110 107 104 107 107  CO2 19*  --  22 20* 23 21* 25 24  ANIONGAP 10  --  10 8 8 10 11 9   GLUCOSE 137*  --  105* 90 99 99 74 93  BUN 37*  --  34* 27* 20 15 17 16   CREATININE 1.28*  --  0.97 0.90 0.95 0.65 0.48 0.75  AST 68*  --   --   --   --   --   --   --   ALT 45*  --   --   --   --   --   --   --   ALKPHOS 139*  --   --   --   --   --   --   --   BILITOT 1.3*  --   --   --   --   --   --   --   ALBUMIN 3.4*  --   --   --   --   --   --   --    LATICACIDVEN  --  0.7  --   --   --   --   --   --   MG  --   --  2.0  --   --   --   --   --  CALCIUM 8.6*  --  8.5* 8.2* 8.2* 7.9* 8.6* 8.5*      Recent Labs  Lab 06/01/23 1925 06/02/23 0514 06/03/23 0727 06/04/23 0436 06/05/23 0506 06/06/23 0527 06/07/23 0502  LATICACIDVEN 0.7  --   --   --   --   --   --   MG  --  2.0  --   --   --   --   --   CALCIUM  --  8.5* 8.2* 8.2* 7.9* 8.6* 8.5*    --------------------------------------------------------------------------------------------------------------- No results found for: "CHOL", "HDL", "LDLCALC", "LDLDIRECT", "TRIG", "CHOLHDL"  No results found for: "HGBA1C" No results for input(s): "TSH", "T4TOTAL", "FREET4", "T3FREE", "THYROIDAB" in the last 72 hours. No results for input(s): "VITAMINB12", "FOLATE", "FERRITIN", "TIBC", "IRON", "RETICCTPCT" in the last 72 hours. ------------------------------------------------------------------------------------------------------------------ Cardiac Enzymes No results for input(s): "CKMB", "TROPONINI", "MYOGLOBIN" in the last 168 hours.  Invalid input(s): "CK"  Micro Results Recent Results (from the past 240 hours)  Blood culture (routine x 2)     Status: None   Collection Time: 06/01/23  5:49 PM   Specimen: BLOOD  Result Value Ref Range Status   Specimen Description   Final    BLOOD SITE NOT SPECIFIED Performed at [facility], [address], [place], [place] [address]    Special Requests   Final    BOTTLES DRAWN AEROBIC AND ANAEROBIC Blood Culture results may not be optimal due to an inadequate volume of blood received in culture bottles Performed at [facility], [address], [place], [place] [address]    Culture   Final    NO GROWTH 5 DAYS Performed at [facility], [address], [place], [place] [address]    Report Status 06/06/2023 FINAL  Final  Blood culture (routine x 2)     Status: None   Collection Time: 06/01/23   5:54 PM   Specimen: BLOOD  Result Value Ref Range Status   Specimen Description   Final    BLOOD BLOOD RIGHT ARM Performed at [facility], [address] [place], [place] [address]    Special Requests   Final    BOTTLES DRAWN AEROBIC AND ANAEROBIC Blood Culture results may not be optimal due to an inadequate volume of blood received in culture bottles Performed at [facility], [address] [place], [place] [address]    Culture   Final    NO GROWTH 5 DAYS Performed at [facility], [address], [place], [place] [address]    Report Status 06/06/2023 FINAL  Final    Radiology Reports CT ABDOMEN PELVIS W CONTRAST Result Date: 06/06/2023 CLINICAL DATA:  Abdominal pain and emesis EXAM: CT ABDOMEN AND PELVIS WITH CONTRAST TECHNIQUE: Multidetector CT imaging of the abdomen and pelvis was performed using the standard protocol following bolus administration of intravenous contrast. RADIATION DOSE REDUCTION: This exam was performed according to the departmental dose-optimization program which includes automated exposure control, adjustment of the mA and/or kV according to patient size and/or use of iterative reconstruction technique. CONTRAST:  OMNIPAQUE IOHEXOL 300 MG/ML SOLN, 30mL OMNIPAQUE IOHEXOL 300 MG/ML SOLN COMPARISON:  07/02/2023 FINDINGS: Lower chest: Lung bases are free of acute infiltrate or sizable effusion. Mild atelectasis is noted in the right lung base. Hepatobiliary: Gallbladder is well distended without definitive calculus. Very mild intrahepatic biliary ductal dilatation is noted. The liver is otherwise within normal limits. No discrete distal common bile duct stone is noted. A  duodenal diverticulum is noted adjacent to the distal CBD which may cause some extrinsic compression. Pancreas: Unremarkable. No pancreatic ductal dilatation or surrounding inflammatory changes. Spleen: Normal in size without focal abnormality.  Adrenals/Urinary Tract: Adrenal glands are within normal limits. Kidneys demonstrate a normal enhancement pattern bilaterally. No renal calculi or obstructive changes are noted. Bladder is partially distended. Stomach/Bowel: Contrast material is noted throughout the colon. Some mild retained fecal material is seen without obstructive change. The appendix is partially visualized and may represent an appendiceal stump. Correlate with surgical history. Small bowel and stomach are within normal limits with the exception of a small sliding-type hiatal hernia. Vascular/Lymphatic: Aortic atherosclerosis. No enlarged abdominal or pelvic lymph nodes. Reproductive: Status post hysterectomy. No adnexal masses. Other: No abdominal wall hernia or abnormality. No abdominopelvic ascites. Musculoskeletal: No acute or significant osseous findings. IMPRESSION: Well distended gallbladder with evidence of mild intrahepatic biliary ductal dilatation. Correlation with laboratory values is recommended. No definitive CBD stone is seen although a dilated duodenal diverticulum is noted which may cause some localized compression upon the duct. No other focal abnormality is noted. Electronically Signed   By: Alcide Clever M.D.   On: 06/06/2023 19:50       Signature  -   Fran Lowes M.D on 06/07/2023 at 3:56 PM   -  To page go to [email or web address]

## 2023-06-07 NOTE — Plan of Care (Signed)
?  Problem: Clinical Measurements: ?Goal: Will remain free from infection ?Outcome: Progressing ?  ?

## 2023-06-07 NOTE — Plan of Care (Signed)
  Problem: Activity: Goal: Risk for activity intolerance will decrease Outcome: Progressing   Problem: Coping: Goal: Level of anxiety will decrease Outcome: Progressing   Problem: Elimination: Goal: Will not experience complications related to bowel motility Outcome: Progressing Goal: Will not experience complications related to urinary retention Outcome: Progressing   Problem: Pain Managment: Goal: General experience of comfort will improve and/or be controlled Outcome: Progressing   Problem: Safety: Goal: Ability to remain free from injury will improve Outcome: Progressing

## 2023-06-08 DIAGNOSIS — K529 Noninfective gastroenteritis and colitis, unspecified: Secondary | ICD-10-CM | POA: Diagnosis not present

## 2023-06-08 LAB — CBC WITH DIFFERENTIAL/PLATELET
Abs Immature Granulocytes: 0.22 10*3/uL — ABNORMAL HIGH (ref 0.00–0.07)
Basophils Absolute: 0.1 10*3/uL (ref 0.0–0.1)
Basophils Relative: 1 %
Eosinophils Absolute: 0.5 10*3/uL (ref 0.0–0.5)
Eosinophils Relative: 3 %
HCT: 35 % — ABNORMAL LOW (ref 36.0–46.0)
Hemoglobin: 10.9 g/dL — ABNORMAL LOW (ref 12.0–15.0)
Immature Granulocytes: 2 %
Lymphocytes Relative: 12 %
Lymphs Abs: 1.6 10*3/uL (ref 0.7–4.0)
MCH: 28.7 pg (ref 26.0–34.0)
MCHC: 31.1 g/dL (ref 30.0–36.0)
MCV: 92.1 fL (ref 80.0–100.0)
Monocytes Absolute: 0.8 10*3/uL (ref 0.1–1.0)
Monocytes Relative: 6 %
Neutro Abs: 10.6 10*3/uL — ABNORMAL HIGH (ref 1.7–7.7)
Neutrophils Relative %: 76 %
Platelets: 445 10*3/uL — ABNORMAL HIGH (ref 150–400)
RBC: 3.8 MIL/uL — ABNORMAL LOW (ref 3.87–5.11)
RDW: 13.3 % (ref 11.5–15.5)
WBC: 13.7 10*3/uL — ABNORMAL HIGH (ref 4.0–10.5)
nRBC: 0 % (ref 0.0–0.2)

## 2023-06-08 LAB — BASIC METABOLIC PANEL
Anion gap: 9 (ref 5–15)
BUN: 15 mg/dL (ref 8–23)
CO2: 26 mmol/L (ref 22–32)
Calcium: 9 mg/dL (ref 8.9–10.3)
Chloride: 104 mmol/L (ref 98–111)
Creatinine, Ser: 0.66 mg/dL (ref 0.44–1.00)
GFR, Estimated: >60 mL/min (ref >60)
Glucose, Bld: 92 mg/dL (ref 70–99)
Potassium: 4.5 mmol/L (ref 3.5–5.1)
Sodium: 139 mmol/L (ref 135–145)

## 2023-06-08 MED ORDER — OXYCODONE HCL 5 MG PO TABS: 5.0000 mg | ORAL_TABLET | Freq: Four times a day (QID) | ORAL | 0 refills | Status: AC | PRN

## 2023-06-08 MED ORDER — CARVEDILOL 12.5 MG PO TABS: 12.5000 mg | ORAL_TABLET | Freq: Two times a day (BID) | ORAL | 11 refills | Status: AC

## 2023-06-08 MED ORDER — AMOXICILLIN-POT CLAVULANATE 875-125 MG PO TABS: 1.0000 | ORAL_TABLET | Freq: Two times a day (BID) | ORAL | 0 refills | Status: AC

## 2023-06-08 NOTE — Plan of Care (Signed)
   Problem: Clinical Measurements: Goal: Will remain free from infection Outcome: Progressing Goal: Diagnostic test results will improve Outcome: Progressing

## 2023-06-08 NOTE — Progress Notes (Signed)
Discharge instructions discussed with patient, verbalized agreement and understanding 

## 2023-08-09 NOTE — Discharge Summary (Signed)
 Physician Discharge Summary   Patient: Adrienne Marsh MRN: [id number] DOB: [birth year]  Admit date:     06/01/2023  Discharge date: 06/08/2023  Discharge Physician: Fran Lowes   PCP: [name], NP   Recommendations at discharge:    Discharge to home Follow up with PCP in 7-10 days Complete antibiotics.  Discharge Diagnoses: Principal Problem:   Enteritis Active Problems:   Nausea vomiting and diarrhea  Resolved Problems:   * No resolved hospital problems. [facility] Course: Expand All Collapse All                                                                     PROGRESS NOTE                                                                                                                                                                                                              Patient Demographics:     Adrienne Marsh, is a 68 y.o. female, DOB - Apr 10, 1956, [id number]   Outpatient Primary MD for the patient is [name], NP    LOS - 5  Admit date - 06/01/2023        Chief Complaint  Patient presents with   Abdominal Pain   Emesis        Brief Narrative (HPI from H&P)   Adrienne Marsh is a 68 y.o. female with medical history significant for PTSD, anxiety and depression, panic attacks, HLD, HTN, paroxysmal SVT, hiatal hernia, urge incontinence, diverticulitis s/p colon resection and gastritis who presented to the ED for evaluation of abdominal pain, nausea and vomiting.  CT A/P on admission showed evidence of acute cystitis with microperforation but no evidence of abscess or bowel obstruction.  General surgery was consulted and patient was started on IV Zosyn. They have now signed off and have recommended only treatment as for enteritis.    The patient is extremely anxious. At one moment the patient will say that her pain is better. In the next moment she will say that it is only better because of the pain medication. As her WBC has increased from 7.5 to 11.3 - 11.6 and  her physical exam is equivocal and the patient is not consistent when reporting on her pain.   CT of  the patient's abdomen and pelvis demonstrated a distended gallbladder without signs of acute cholecystitis. The microperf and enteritis were not mentioned. The WBC has decreased to 11.2.   The patient will be ambulated in the halls and converted to oral antibiotics.   The patient is doing well and will be discharged to home today.      Assessment and Plan:  Acute enteritis with microperforation She continues to have LLQ pain. Tolerating p.o. intake without nausea or vomiting this morning. Leukocytosis has resolved, she remains afebrile.  No surgical intervention per general surgery, signed off today. The recommendation is for treatment as for enteritis.  -TRansition to oral antibiotics. -IV Dilaudid will be stopped. Oxycodone will be started in its place. -Blood cultures no growth after 48 hours -Anticipate discharge home when pain and ability to take regular PO have improved. -CT of the patient's abdomen and pelvis demonstrated a distended gallbladder without signs of acute cholecystitis. The microperf and enteritis were not mentioned.-The WBC has decreased to 11.2.     AKI, resolved -Creatinine has returned to baseline. IV fluids stopped.   HTN BP improved with SBP in the 130s -Continue Lopressor -Resume home lisinopril   HLD -Continue atorvastatin   PTSD Anxiety and depression -Continue sertraline, prazosin and Ativan -Pt is quite anxious about the prospect of being discharged.   GERD -Continue Protonix.   Hiatal hernia Noted. Stable.   Gastritis -Continue Protonix and as needed Pepcid   Urge incontinence -Continue Myrbetriq   Obesity: Estimated body mass index is 25.09 kg/m as calculated from the following:   Height as of this encounter: 5\' 11"  (1.803 m).   Weight as of this encounter: 81.6 kg.            Consultants: General Surgery Procedures  performed: None  Disposition: Home Diet recommendation:  Discharge Diet Orders (From admission, onward)     Start     Ordered   06/08/23 0000  Diet - low sodium heart healthy        06/08/23 1325           Cardiac diet DISCHARGE MEDICATION: Allergies as of 06/08/2023       Reactions   Codeine Other (See Comments)   "Makes me feel funny" and dizziness        Medication List     STOP taking these medications    amoxicillin 875 MG tablet Commonly known as: AMOXIL   metoprolol tartrate 25 MG tablet Commonly known as: LOPRESSOR       TAKE these medications    aluminum chloride 20 % external solution Commonly known as: DRYSOL Apply 1 application  topically at bedtime as needed (to underarms, for sweating).   atorvastatin 20 MG tablet Commonly known as: LIPITOR Take 20 mg by mouth daily.   carvedilol 12.5 MG tablet Commonly known as: Coreg Take 1 tablet (12.5 mg total) by mouth 2 (two) times daily.   cyclobenzaprine 5 MG tablet Commonly known as: FLEXERIL Take 5 mg by mouth 3 (three) times daily as needed for muscle spasms (in the back).   lisinopril 10 MG tablet Commonly known as: ZESTRIL Take 10 mg by mouth daily.   LORazepam 1 MG tablet Commonly known as: Ativan Takes 1 in the morning and 2 at hs prn anxiety What changed:  how much to take how to take this when to take this additional instructions   multivitamin with minerals Tabs tablet Take 1 tablet by mouth daily with breakfast.   Myrbetriq 25  MG Tb24 tablet Generic drug: mirabegron ER Take 25 mg by mouth at bedtime.   pantoprazole 40 MG tablet Commonly known as: PROTONIX Take 40 mg by mouth at bedtime.   prazosin 1 MG capsule Commonly known as: MINIPRESS Take 1 mg by mouth at bedtime.   sertraline 100 MG tablet Commonly known as: Zoloft Take 2 tablets (200 mg total) by mouth daily.   TYLENOL 500 MG tablet Generic drug: acetaminophen Take 1,000 mg by mouth every 6 (six) hours as  needed for headache or mild pain (pain score 1-3).   Zantac [address] 20 MG tablet Generic drug: famotidine Take 20 mg by mouth 2 (two) times daily as needed for heartburn or indigestion.       ASK your doctor about these medications    amoxicillin-clavulanate 875-125 MG tablet Commonly known as: AUGMENTIN Take 1 tablet by mouth every 12 (twelve) hours for 9 days. Ask about: Should I take this medication?   oxyCODONE 5 MG immediate release tablet Commonly known as: Oxy IR/ROXICODONE Take 1 tablet (5 mg total) by mouth every 6 (six) hours as needed for up to 3 days for severe pain (pain score 7-10), moderate pain (pain score 4-6) or breakthrough pain. Ask about: Should I take this medication?        Discharge Exam: Filed Weights   06/01/23 2300  Weight: 81.6 kg   Exam:  Constitutional:  The patient is awake, alert, and oriented x 3. No acute distress. Eyes:  pupils and irises appear normal Normal lids and conjunctivae ENMT:  grossly normal hearing  Lips appear normal external ears, nose appear normal Oropharynx: mucosa, tongue,posterior pharynx appear normal Neck:  neck appears normal, no masses, normal ROM, supple no thyromegaly Respiratory:  No increased work of breathing. No wheezes, rales, or rhonchi No tactile fremitus Cardiovascular:  Regular rate and rhythm No murmurs, ectopy, or gallups. No lateral PMI. No thrills. Abdomen:  Abdomen is soft, non-tender, non-distended No hernias, masses, or organomegaly Normoactive bowel sounds.  Musculoskeletal:  No cyanosis, clubbing, or edema Skin:  No rashes, lesions, ulcers palpation of skin: no induration or nodules Neurologic:  CN 2-12 intact Sensation all 4 extremities intact Psychiatric:  Mental status Mood, affect appropriate Orientation to person, place, time  judgment and insight appear intact   Condition at discharge: fair  The results of significant diagnostics from this hospitalization  (including imaging, microbiology, ancillary and laboratory) are listed below for reference.   Imaging Studies: No results found.  Microbiology: Results for orders placed or performed during the hospital encounter of 06/01/23  Blood culture (routine x 2)     Status: None   Collection Time: 06/01/23  5:49 PM   Specimen: BLOOD  Result Value Ref Range Status   Specimen Description   Final    BLOOD SITE NOT SPECIFIED Performed at [facility], [address], [place], [place] [address]    Special Requests   Final    BOTTLES DRAWN AEROBIC AND ANAEROBIC Blood Culture results may not be optimal due to an inadequate volume of blood received in culture bottles Performed at [facility], [address], [place], [place] [address]    Culture   Final    NO GROWTH 5 DAYS Performed at [facility], [address], [place], [place] [address]    Report Status 06/06/2023 FINAL  Final  Blood culture (routine x 2)     Status: None   Collection Time: 06/01/23  5:54  PM   Specimen: BLOOD  Result Value Ref Range Status   Specimen Description   Final    BLOOD BLOOD RIGHT ARM Performed at [facility], [address] [place], [place] [address]    Special Requests   Final    BOTTLES DRAWN AEROBIC AND ANAEROBIC Blood Culture results may not be optimal due to an inadequate volume of blood received in culture bottles Performed at [facility], [address], [place], [place] [address]    Culture   Final    NO GROWTH 5 DAYS Performed at [facility], [address], [place], [place] [address]    Report Status 06/06/2023 FINAL  Final    Labs: CBC: No results for input(s): "WBC", "NEUTROABS", "HGB", "HCT", "MCV", "PLT" in the last 168 hours. Basic Metabolic Panel: No results for input(s): "NA", "K", "CL", "CO2", "GLUCOSE", "BUN", "CREATININE", "CALCIUM", "MG", "PHOS" in the last 168 hours. Liver Function  Tests: No results for input(s): "AST", "ALT", "ALKPHOS", "BILITOT", "PROT", "ALBUMIN" in the last 168 hours. CBG: No results for input(s): "GLUCAP" in the last 168 hours.  Discharge time spent: greater than 30 minutes.  Signed: Chalee Hirota, DO [facility] 08/09/2023

## 2023-09-04 ENCOUNTER — Ambulatory Visit

## 2023-09-04 ENCOUNTER — Ambulatory Visit: Payer: Self-pay
